# Patient Record
Sex: Female | Born: 1960 | Race: White | Hispanic: No | Marital: Married | Smoking: Never smoker
Health system: Southern US, Community
[De-identification: ages and names within clinical notes are randomized; demographics above are authoritative.]

## PROBLEM LIST (undated history)

## (undated) DIAGNOSIS — K219 Gastro-esophageal reflux disease without esophagitis: Secondary | ICD-10-CM

## (undated) DIAGNOSIS — T7840XA Allergy, unspecified, initial encounter: Secondary | ICD-10-CM

## (undated) DIAGNOSIS — C439 Malignant melanoma of skin, unspecified: Secondary | ICD-10-CM

## (undated) DIAGNOSIS — G43909 Migraine, unspecified, not intractable, without status migrainosus: Secondary | ICD-10-CM

## (undated) DIAGNOSIS — C4491 Basal cell carcinoma of skin, unspecified: Secondary | ICD-10-CM

## (undated) DIAGNOSIS — E079 Disorder of thyroid, unspecified: Secondary | ICD-10-CM

## (undated) DIAGNOSIS — E039 Hypothyroidism, unspecified: Secondary | ICD-10-CM

## (undated) DIAGNOSIS — B009 Herpesviral infection, unspecified: Secondary | ICD-10-CM

## (undated) DIAGNOSIS — D649 Anemia, unspecified: Secondary | ICD-10-CM

## (undated) DIAGNOSIS — M810 Age-related osteoporosis without current pathological fracture: Secondary | ICD-10-CM

## (undated) HISTORY — DX: Gastro-esophageal reflux disease without esophagitis: K21.9

## (undated) HISTORY — PX: OTHER SURGICAL HISTORY: SHX169

## (undated) HISTORY — DX: Age-related osteoporosis without current pathological fracture: M81.0

## (undated) HISTORY — DX: Disorder of thyroid, unspecified: E07.9

## (undated) HISTORY — DX: Malignant melanoma of skin, unspecified: C43.9

## (undated) HISTORY — PX: LASIK: SHX215

## (undated) HISTORY — DX: Basal cell carcinoma of skin, unspecified: C44.91

## (undated) HISTORY — PX: TONSILLECTOMY: SUR1361

## (undated) HISTORY — PX: BASAL CELL CARCINOMA EXCISION: SHX1214

## (undated) HISTORY — DX: Herpesviral infection, unspecified: B00.9

## (undated) HISTORY — PX: MOHS SURGERY: SUR867

## (undated) HISTORY — PX: LAPAROSCOPIC CHOLECYSTECTOMY: SUR755

## (undated) HISTORY — DX: Migraine, unspecified, not intractable, without status migrainosus: G43.909

## (undated) HISTORY — PX: GYNECOLOGIC CRYOSURGERY: SHX857

## (undated) HISTORY — DX: Allergy, unspecified, initial encounter: T78.40XA

## (undated) HISTORY — DX: Anemia, unspecified: D64.9

---

## 1998-01-16 ENCOUNTER — Other Ambulatory Visit: Admission: RE | Admit: 1998-01-16 | Discharge: 1998-01-16 | Payer: Self-pay | Admitting: Gynecology

## 1998-06-25 ENCOUNTER — Other Ambulatory Visit: Admission: RE | Admit: 1998-06-25 | Discharge: 1998-06-25 | Payer: Self-pay | Admitting: Gynecology

## 1999-08-18 ENCOUNTER — Other Ambulatory Visit: Admission: RE | Admit: 1999-08-18 | Discharge: 1999-08-18 | Payer: Self-pay | Admitting: Gynecology

## 1999-11-13 ENCOUNTER — Encounter: Payer: Self-pay | Admitting: Gastroenterology

## 1999-11-13 ENCOUNTER — Encounter (INDEPENDENT_AMBULATORY_CARE_PROVIDER_SITE_OTHER): Payer: Self-pay | Admitting: *Deleted

## 1999-11-13 ENCOUNTER — Ambulatory Visit (HOSPITAL_COMMUNITY): Admission: RE | Admit: 1999-11-13 | Discharge: 1999-11-13 | Payer: Self-pay | Admitting: Gastroenterology

## 1999-11-20 ENCOUNTER — Ambulatory Visit (HOSPITAL_COMMUNITY): Admission: RE | Admit: 1999-11-20 | Discharge: 1999-11-20 | Payer: Self-pay | Admitting: Gastroenterology

## 1999-11-20 ENCOUNTER — Encounter (INDEPENDENT_AMBULATORY_CARE_PROVIDER_SITE_OTHER): Payer: Self-pay | Admitting: *Deleted

## 2000-01-14 ENCOUNTER — Ambulatory Visit (HOSPITAL_COMMUNITY): Admission: RE | Admit: 2000-01-14 | Discharge: 2000-01-14 | Payer: Self-pay | Admitting: Gastroenterology

## 2000-01-14 ENCOUNTER — Encounter: Payer: Self-pay | Admitting: Gastroenterology

## 2000-01-25 ENCOUNTER — Encounter: Admission: RE | Admit: 2000-01-25 | Discharge: 2000-01-25 | Payer: Self-pay | Admitting: Gastroenterology

## 2000-01-25 ENCOUNTER — Encounter: Payer: Self-pay | Admitting: Gastroenterology

## 2000-11-15 ENCOUNTER — Other Ambulatory Visit: Admission: RE | Admit: 2000-11-15 | Discharge: 2000-11-15 | Payer: Self-pay | Admitting: Gynecology

## 2001-05-04 ENCOUNTER — Other Ambulatory Visit: Admission: RE | Admit: 2001-05-04 | Discharge: 2001-05-04 | Payer: Self-pay | Admitting: Obstetrics and Gynecology

## 2001-12-26 ENCOUNTER — Other Ambulatory Visit: Admission: RE | Admit: 2001-12-26 | Discharge: 2001-12-26 | Payer: Self-pay | Admitting: Gynecology

## 2003-03-19 ENCOUNTER — Other Ambulatory Visit: Admission: RE | Admit: 2003-03-19 | Discharge: 2003-03-19 | Payer: Self-pay | Admitting: Gynecology

## 2004-03-31 ENCOUNTER — Other Ambulatory Visit: Admission: RE | Admit: 2004-03-31 | Discharge: 2004-03-31 | Payer: Self-pay | Admitting: Gynecology

## 2005-05-20 ENCOUNTER — Other Ambulatory Visit: Admission: RE | Admit: 2005-05-20 | Discharge: 2005-05-20 | Payer: Self-pay | Admitting: Gynecology

## 2006-01-18 ENCOUNTER — Ambulatory Visit (HOSPITAL_COMMUNITY): Admission: RE | Admit: 2006-01-18 | Discharge: 2006-01-18 | Payer: Self-pay | Admitting: Internal Medicine

## 2006-02-25 ENCOUNTER — Encounter (INDEPENDENT_AMBULATORY_CARE_PROVIDER_SITE_OTHER): Payer: Self-pay | Admitting: Specialist

## 2006-02-25 ENCOUNTER — Ambulatory Visit (HOSPITAL_COMMUNITY): Admission: RE | Admit: 2006-02-25 | Discharge: 2006-02-25 | Payer: Self-pay | Admitting: Surgery

## 2006-02-25 ENCOUNTER — Encounter (INDEPENDENT_AMBULATORY_CARE_PROVIDER_SITE_OTHER): Payer: Self-pay | Admitting: *Deleted

## 2008-09-30 DIAGNOSIS — K222 Esophageal obstruction: Secondary | ICD-10-CM

## 2008-10-01 ENCOUNTER — Ambulatory Visit: Payer: Self-pay | Admitting: Gastroenterology

## 2008-10-01 DIAGNOSIS — K5900 Constipation, unspecified: Secondary | ICD-10-CM | POA: Insufficient documentation

## 2008-10-01 DIAGNOSIS — R519 Headache, unspecified: Secondary | ICD-10-CM | POA: Insufficient documentation

## 2008-10-01 DIAGNOSIS — Z8719 Personal history of other diseases of the digestive system: Secondary | ICD-10-CM

## 2008-10-01 DIAGNOSIS — C439 Malignant melanoma of skin, unspecified: Secondary | ICD-10-CM | POA: Insufficient documentation

## 2008-10-01 DIAGNOSIS — C4490 Unspecified malignant neoplasm of skin, unspecified: Secondary | ICD-10-CM

## 2008-10-01 DIAGNOSIS — R51 Headache: Secondary | ICD-10-CM | POA: Insufficient documentation

## 2008-10-01 DIAGNOSIS — K219 Gastro-esophageal reflux disease without esophagitis: Secondary | ICD-10-CM | POA: Insufficient documentation

## 2008-10-01 DIAGNOSIS — D649 Anemia, unspecified: Secondary | ICD-10-CM

## 2008-10-01 LAB — CONVERTED CEMR LAB: Tissue Transglutaminase Ab, IgA: 0.5 units (ref <7)

## 2008-10-02 ENCOUNTER — Telehealth: Payer: Self-pay | Admitting: Gastroenterology

## 2008-10-02 LAB — CONVERTED CEMR LAB
AST: 16 units/L (ref 0–37)
Albumin: 4 g/dL (ref 3.5–5.2)
Alkaline Phosphatase: 35 units/L — ABNORMAL LOW (ref 39–117)
BUN: 15 mg/dL (ref 6–23)
Basophils Relative: 0.8 % (ref 0.0–3.0)
CO2: 28 meq/L (ref 19–32)
Calcium: 9.1 mg/dL (ref 8.4–10.5)
Eosinophils Relative: 2.1 % (ref 0.0–5.0)
Folate: 14.6 ng/mL
GFR calc non Af Amer: 94.15 mL/min (ref >60)
Glucose, Bld: 91 mg/dL (ref 70–99)
HCT: 35.7 % — ABNORMAL LOW (ref 36.0–46.0)
Hemoglobin: 11.6 g/dL — ABNORMAL LOW (ref 12.0–15.0)
Lymphs Abs: 2.2 10*3/uL (ref 0.7–4.0)
MCV: 82.9 fL (ref 78.0–100.0)
Monocytes Absolute: 0.8 10*3/uL (ref 0.1–1.0)
Monocytes Relative: 11.3 % (ref 3.0–12.0)
Platelets: 375 10*3/uL (ref 150.0–400.0)
RBC: 4.3 M/uL (ref 3.87–5.11)
Saturation Ratios: 3.9 % — ABNORMAL LOW (ref 20.0–50.0)
Sodium: 142 meq/L (ref 135–145)
TSH: 4.85 microintl units/mL (ref 0.35–5.50)
Total Protein: 7.5 g/dL (ref 6.0–8.3)
WBC: 7.5 10*3/uL (ref 4.5–10.5)

## 2008-10-19 DIAGNOSIS — C439 Malignant melanoma of skin, unspecified: Secondary | ICD-10-CM

## 2008-10-19 HISTORY — DX: Malignant melanoma of skin, unspecified: C43.9

## 2008-11-06 ENCOUNTER — Ambulatory Visit: Payer: Self-pay | Admitting: Gastroenterology

## 2008-11-06 ENCOUNTER — Encounter: Payer: Self-pay | Admitting: Gastroenterology

## 2008-11-06 DIAGNOSIS — K2990 Gastroduodenitis, unspecified, without bleeding: Secondary | ICD-10-CM

## 2008-11-06 DIAGNOSIS — K2970 Gastritis, unspecified, without bleeding: Secondary | ICD-10-CM | POA: Insufficient documentation

## 2008-11-06 LAB — CONVERTED CEMR LAB: UREASE: NEGATIVE

## 2008-11-08 ENCOUNTER — Encounter: Payer: Self-pay | Admitting: Gastroenterology

## 2008-11-20 ENCOUNTER — Ambulatory Visit: Payer: Self-pay | Admitting: Gastroenterology

## 2008-11-21 ENCOUNTER — Telehealth: Payer: Self-pay | Admitting: Gastroenterology

## 2008-12-17 ENCOUNTER — Ambulatory Visit: Payer: Self-pay | Admitting: Gastroenterology

## 2008-12-18 LAB — CONVERTED CEMR LAB
Ferritin: 13.4 ng/mL (ref 10.0–291.0)
Vitamin B-12: 387 pg/mL (ref 211–911)

## 2009-02-04 ENCOUNTER — Ambulatory Visit: Payer: Self-pay | Admitting: Gastroenterology

## 2009-04-10 ENCOUNTER — Telehealth (INDEPENDENT_AMBULATORY_CARE_PROVIDER_SITE_OTHER): Payer: Self-pay | Admitting: *Deleted

## 2009-08-04 ENCOUNTER — Telehealth: Payer: Self-pay | Admitting: Gastroenterology

## 2009-11-24 ENCOUNTER — Encounter: Admission: RE | Admit: 2009-11-24 | Discharge: 2009-11-24 | Payer: Self-pay | Admitting: Obstetrics & Gynecology

## 2010-07-09 ENCOUNTER — Other Ambulatory Visit: Payer: Self-pay | Admitting: Dermatology

## 2010-07-12 ENCOUNTER — Encounter: Payer: Self-pay | Admitting: Obstetrics & Gynecology

## 2010-07-23 NOTE — Progress Notes (Signed)
Summary: Medication refill  Phone Note Call from Patient Call back at Work Phone [phone]   Caller: Patient Call For: Dr. Jarold Motto Reason for Call: Refill Medication Summary of Call: Pt needs a refill on her Nexium Initial call taken by: Karna Christmas,  August 04, 2009 12:11 PM  Follow-up for Phone Call        LM for pt that [facility]. Follow-up by: Ashok Cordia RN,  August 04, 2009 1:54 PM    Prescriptions: NEXIUM 40 MG CPDR (ESOMEPRAZOLE MAGNESIUM) 1 capsule by mouth two times a day  #60 x 6   Entered by:   Ashok Cordia RN   Authorized by:   Mardella Layman MD [facility]   Signed by:   Ashok Cordia RN on 08/04/2009   Method used:   [facility] to        [facility] [phone]* (retail)       [address]       [place], [place]  [address]       Ph: [phone] or [phone]       Fax: [phone]   RxID:   [id number]   Appended Document: Medication refill Pt called back,  Wants rx sent to [facility].     Clinical Lists Changes  Medications: Rx of NEXIUM 40 MG CPDR (ESOMEPRAZOLE MAGNESIUM) 1 capsule by mouth two times a day;  #180 x 4;  Signed;  Entered by: Ashok Cordia RN;  Authorized by: Mardella Layman MD [facility];  Method used: Electronically to [facility]*, , ,   , Ph: [phone], Fax: [phone]    Prescriptions: NEXIUM 40 MG CPDR (ESOMEPRAZOLE MAGNESIUM) 1 capsule by mouth two times a day  #180 x 4   Entered by:   Ashok Cordia RN   Authorized by:   Mardella Layman MD [facility]   Signed by:   Ashok Cordia RN on 08/04/2009   Method used:   Electronically to        [facility]* (mail-order)             ,          Ph: [phone]       Fax: [phone]   RxID:   [id number]

## 2010-09-28 ENCOUNTER — Other Ambulatory Visit: Payer: Self-pay | Admitting: Gastroenterology

## 2010-09-28 NOTE — Telephone Encounter (Signed)
Needs office visit.

## 2010-10-20 ENCOUNTER — Other Ambulatory Visit: Payer: Self-pay | Admitting: Obstetrics & Gynecology

## 2010-10-20 DIAGNOSIS — Z1231 Encounter for screening mammogram for malignant neoplasm of breast: Secondary | ICD-10-CM

## 2010-11-06 NOTE — Op Note (Signed)
NAMELENORE, MOYANO               ACCOUNT NO.:  [id number]   MEDICAL RECORD NO.:  [id number]          PATIENT TYPE:  AMB   LOCATION:  DAY                          FACILITY:  [facility]   PHYSICIAN:  Wilmon Arms. Corliss Skains, M.D. DATE OF BIRTH:  04-25-1961   DATE OF PROCEDURE:  DATE OF DISCHARGE:                                 OPERATIVE REPORT   PREOPERATIVE DIAGNOSIS:  Chronic calculous cholecystitis.   POSTOPERATIVE DIAGNOSIS:  Chronic calculous cholecystitis.   PROCEDURE PERFORMED:  Laparoscopic cholecystectomy with intraoperative  cholangiogram.   SURGEON:  Wilmon Arms. Tsuei, M.D.   ANESTHESIA:  General endotracheal.   INDICATIONS:  The patient is a healthy 50 year old female who presents with  several years of epigastric pain.  Recently this has become more severe.  It  is associated with nausea, bloating and radiation into her back.  This  usually is postprandial in nature.  An ultrasound showed multiple gallstones  with normal gallbladder wall thickness.  Liver function tests were normal.  She was referred for surgical evaluation and we recommended laparoscopic  cholecystectomy.   DESCRIPTION OF PROCEDURE:  The patient brought to the operating room, placed  in the supine position on operating table.  After an adequate level of  general anesthesia was obtained, the patient's abdomen was prepped with  Betadine and draped in sterile fashion.  A time-out was taken to assure  proper patient, proper procedure.  Her umbilicus was infiltrated with 0.25%  Marcaine.  A vertical incision was made just below umbilicus.  Dissection  was carried down to the fascia which was opened vertically.  The peritoneal  cavity was bluntly entered.  The pursestring sutures of 0 Vicryl was placed  around the fascial opening.  A Hasson cannula was inserted and secured with  stay suture.  Pneumoperitoneum was obtained by insufflating CO2 maintaining  maximal pressure of 15 mmHg.  The laparoscope was inserted  as the patient  was rotated in reverse Trendelenburg position rotated slightly to her left.  A very large liver was noted.  A 10 mm port was placed in subxiphoid  position.  Two 5 mL ports placed in right upper quadrant.  The liver was  retracted superiorly exposing the gallbladder.  There were some flimsy  omental adhesions to the surface of the gallbladder which were taken down  with cautery.  The gallbladder was retracted superiorly over the edge of the  liver.  We dissected around the hilum of the gallbladder exposing the cystic  duct.  We also identified the cystic artery.  These were both ligated with  clips.  The cystic duct was ligated distally.  A small opening was created  on the cystic duct and a Cook cholangiogram catheter was threaded into the  cystic duct.  A cholangiogram was obtained showing good flow proximally and  distally with good flow of contrast into the duodenum and no evidence of  filling defects.  The catheter was then removed.  The cystic duct was  ligated with clips and divided.  An additional branch of the cystic artery  was ligated with clips and divided.  Cautery was then used to remove the  gallbladder from the liver bed.  The gallbladder was placed in EndoCatch  sac.  We irrigated the right upper quadrant and hemostasis was felt to be  good.  The patient was rotated back to a flat position and the gallbladder  was removed  through the umbilical port.  The irrigant was suctioned out of right upper  quadrant.  No bleeding was noted.  Pneumoperitoneum was then evacuated as  ports were removed under direct vision.  4-0 Monocryl was used to close the  skin in subcuticular fashion.  All sponge, instrument, needle counts  correct.      Wilmon Arms. Tsuei, M.D.  Electronically Signed     MKT/MEDQ  D:  02/25/2006  T:  02/25/2006  Job:  [id number]

## 2010-11-06 NOTE — Procedures (Signed)
[facility]  Patient:    Jaclyn Alexander, Jaclyn Alexander                      MRN: [id number] Proc. Date: 11/13/99 Adm. Date:  [id number] Disc. Date: [id number] Attending:  Deneen Harts CC:         Dr. Mikle Bosworth, Rosalita Levan                           Procedure Report  PROCEDURE:  Panendoscopy, Savary dilation.  INDICATIONS:  A 50 year old white female with recurrent episodes of substernal pain associated with solid food dysphagia, transient impaction.  Remote history of endoscopy January 1998 for similar symptoms.  Patient has been started on NuLev with excellent results.  PROCEDURE:  Using Olympus video endoscope, proximal esophagus was intubated under direct vision following IV sedation totally Versed 5 mg, fentanyl 50 mcg.  Oropharynx was normal.  No lesion of the epiglottis, vocal cords or piriform sinus.  Proximal, mid and distal segments of the esophagus were all normal. Hiatal hernia was not appreciated.  Mucosal Z-line was distinct at 40 cm.  No evidence of stricture.  Gastric fundus, body and antrum were normal.  Pylorus symmetric.  Duodenal bulb and second portion normal.  Retroflexed view of the angularis, lesser curvature, gastric cardia, fundus negative.  Scope withdrawn and Savary guidewire laid along the greater curve of the stomach.  Position maintained fluoroscopically upon withdrawal of the endoscope.  Over the Orad segment of the guidewire, a 17 mm diameter Savary dilator was advanced.  Under fluoroscopic control, this was seen to bridge the diaphragm. The dilator and guidewire were simultaneously withdrawn.  Patient tolerated the procedure without difficulty.  No heme staining, no chest pain.  Patient returned to recovery in stable condition.  Maintained on Datascope monitor throughout.  Maintained on low-flow oxygen throughout.  ASSESSMENT: 1. Normal endoscopy. 2. Savary dilation to 17 mm diameter.  RECOMMENDATION: 1. Antireflux  measures. 2. Continue NuLev. 3. Continue Prilosec 20 mg daily for one month. 4. ROV p.r.n. DD:  11/13/99 TD:  11/17/99 Job: [id number]

## 2010-11-06 NOTE — Procedures (Signed)
[facility]  Patient:    Jaclyn Alexander, Jaclyn Alexander                      MRN: [id number] Proc. Date: 11/20/99 Adm. Date:  [id number] Disc. Date: [id number] Attending:  Deneen Harts CC:         Dr. Talmadge Coventry Linden                           Procedure Report  PROCEDURE:  Colonoscopy.  INDICATION:  A 50 year old white female with intermittent hematochezia. Undergoing colonoscopy for neoplasia surveillance.  DESCRIPTION OF PROCEDURE:  After reviewing the nature of the procedure with the patient, including potential risks and complications, and after discussing alternative methods of diagnosis and treatment, informed consent was signed.  The patient was premedicated, receiving IV sedation totalling Versed 10 mg, fentanyl 100 mcg, administered IV in divided doses prior to and during the course of the procedure.  Using an Olympus pediatric PCF-140L video colonoscope, rectum was intubated after normal digital examination.  The scope was advanced around the entire length of the colon, which was extremely difficult due to a very redundant colon.  The patient has a very steep transverse colon that dives deep into the pelvis.  The patient had to be rotated into a right lateral decubitus position and then with this maneuver, it still required approximately 45 minutes of intubation time.  Preparation was excellent throughout.  The cecum was identified by the appendiceal orifice, ileocecal valve.  Photo documentation obtained.  The scope was slowly withdrawn with careful inspection of the entire colon in a retrograde manner, including retroflexed view in the rectal vault. Examination was entirely normal.  There was no evidence of neoplasia, mucosal inflammation, diverticular disease, or vascular lesion.  Retroflexed view in the rectal vault was normal.  The colon was decompressed, scope withdrawn.  The patient tolerated the procedure without difficulty, being  maintained on Dayscope monitor, low-flow oxygen throughout.  ASSESSMENT: 1. Normal colonoscopy. 2. Difficult intubation.  RECOMMENDATION: 1. Annual Hemoccult. 2. Colonoscopy ten years. 3. Anorectal care p.r.n. 4. Continue Miralax one capsule nightly. DD:  11/20/99 TD:  11/24/99 Job: [id number]

## 2010-12-20 ENCOUNTER — Other Ambulatory Visit: Payer: Self-pay | Admitting: Gastroenterology

## 2010-12-24 ENCOUNTER — Ambulatory Visit: Payer: Self-pay

## 2011-01-01 ENCOUNTER — Ambulatory Visit
Admission: RE | Admit: 2011-01-01 | Discharge: 2011-01-01 | Disposition: A | Discharge status: Home / Self Care | Payer: PRIVATE HEALTH INSURANCE | Source: Ambulatory Visit | Attending: Obstetrics & Gynecology | Admitting: Obstetrics & Gynecology

## 2011-01-01 DIAGNOSIS — Z1231 Encounter for screening mammogram for malignant neoplasm of breast: Secondary | ICD-10-CM

## 2011-03-04 ENCOUNTER — Other Ambulatory Visit: Payer: Self-pay | Admitting: Gastroenterology

## 2011-06-22 DIAGNOSIS — C4491 Basal cell carcinoma of skin, unspecified: Secondary | ICD-10-CM

## 2011-06-22 HISTORY — DX: Basal cell carcinoma of skin, unspecified: C44.91

## 2011-12-10 ENCOUNTER — Other Ambulatory Visit: Payer: Self-pay | Admitting: Obstetrics & Gynecology

## 2011-12-10 DIAGNOSIS — Z1231 Encounter for screening mammogram for malignant neoplasm of breast: Secondary | ICD-10-CM

## 2012-01-26 ENCOUNTER — Ambulatory Visit: Payer: PRIVATE HEALTH INSURANCE

## 2012-02-03 ENCOUNTER — Ambulatory Visit
Admission: RE | Admit: 2012-02-03 | Discharge: 2012-02-03 | Disposition: A | Discharge status: Home / Self Care | Payer: PRIVATE HEALTH INSURANCE | Source: Ambulatory Visit | Attending: Obstetrics & Gynecology | Admitting: Obstetrics & Gynecology

## 2012-02-03 DIAGNOSIS — Z1231 Encounter for screening mammogram for malignant neoplasm of breast: Secondary | ICD-10-CM

## 2012-05-29 DIAGNOSIS — G43009 Migraine without aura, not intractable, without status migrainosus: Secondary | ICD-10-CM | POA: Insufficient documentation

## 2012-10-02 ENCOUNTER — Other Ambulatory Visit: Payer: Self-pay | Admitting: Obstetrics & Gynecology

## 2012-10-02 NOTE — Telephone Encounter (Signed)
Aex sched for 02/08/13 s/w pt she is needing rf's will rf untii

## 2012-10-02 NOTE — Telephone Encounter (Signed)
Aex sched for 02/08/13 s/w pt she is needing rf's will rf until aex//js

## 2012-10-02 NOTE — Telephone Encounter (Signed)
Can I see her chart

## 2012-10-03 NOTE — Telephone Encounter (Signed)
Ok to refill until aex.  Note in chart says has been refilled??

## 2012-10-05 NOTE — Telephone Encounter (Signed)
Yes [facility] has been [facility] 10/02/12

## 2013-01-16 ENCOUNTER — Other Ambulatory Visit: Payer: Self-pay

## 2013-01-16 DIAGNOSIS — Z1231 Encounter for screening mammogram for malignant neoplasm of breast: Secondary | ICD-10-CM

## 2013-02-08 ENCOUNTER — Ambulatory Visit: Payer: Self-pay | Admitting: Obstetrics & Gynecology

## 2013-03-29 ENCOUNTER — Encounter: Payer: Self-pay | Admitting: Obstetrics & Gynecology

## 2013-03-30 ENCOUNTER — Encounter: Payer: Self-pay | Admitting: Obstetrics & Gynecology

## 2013-03-30 ENCOUNTER — Ambulatory Visit
Admission: RE | Admit: 2013-03-30 | Discharge: 2013-03-30 | Disposition: A | Discharge status: Home / Self Care | Payer: 59 | Source: Ambulatory Visit

## 2013-03-30 ENCOUNTER — Ambulatory Visit (INDEPENDENT_AMBULATORY_CARE_PROVIDER_SITE_OTHER): Payer: 59 | Admitting: Obstetrics & Gynecology

## 2013-03-30 VITALS — BP 128/84 | HR 60 | Resp 16 | Ht 63.75 in | Wt 119.0 lb

## 2013-03-30 DIAGNOSIS — Z1231 Encounter for screening mammogram for malignant neoplasm of breast: Secondary | ICD-10-CM

## 2013-03-30 DIAGNOSIS — Z0000 Encounter for general adult medical examination without abnormal findings: Secondary | ICD-10-CM

## 2013-03-30 DIAGNOSIS — Z01419 Encounter for gynecological examination (general) (routine) without abnormal findings: Secondary | ICD-10-CM

## 2013-03-30 LAB — POCT URINALYSIS DIPSTICK
Bilirubin, UA: NEGATIVE
Blood, UA: NEGATIVE
Glucose, UA: NEGATIVE
Ketones, UA: NEGATIVE
Leukocytes, UA: NEGATIVE
Nitrite, UA: NEGATIVE
pH, UA: 6

## 2013-03-30 LAB — TSH: TSH: 2.669 u[IU]/mL (ref 0.350–4.500)

## 2013-03-30 MED ORDER — ESTRADIOL 0.1 MG/GM VA CREA: TOPICAL_CREAM | VAGINAL | Status: DC

## 2013-03-30 MED ORDER — LEVOTHYROXINE SODIUM 50 MCG PO TABS: ORAL_TABLET | ORAL | Status: DC

## 2013-03-30 NOTE — Progress Notes (Deleted)
52 y.o. G1P1 MarriedCaucasianF here for annual exam.    Patient's last menstrual period was 10/20/2010.          Sexually active: yes  The current method of family planning is none.    Exercising: yes  walking 4 x weekly Smoker:  no  Health Maintenance: Pap:  02/03/12 WNL/negative HR HPV History of abnormal Pap:  yes MMG:  02/03/12 normal-scheduled today for 3D Colonoscopy:  6/10 repeat in 10 years BMD:   2012 [facility] TDaP:  7/12 Screening Labs: ***, Hb today: 12.2, Urine today: PH-6.0   reports that she has never smoked. She has never used smokeless tobacco. She reports that she does not drink alcohol or use illicit drugs.  Past Medical History  Diagnosis Date  . Melanoma 5/10    basal cell  . Acid reflux   . Migraine   . Anemia   . HSV-2 infection   . Basal cell carcinoma 1/13    Past Surgical History  Procedure Laterality Date  . Laparoscopic cholecystectomy    . Tonsillectomy    . Dental implants      x2  . Lasik    . Gynecologic cryosurgery  1980s  . Mohs surgery      Current Outpatient Prescriptions  Medication Sig Dispense Refill  . fluticasone (FLONASE) 50 MCG/ACT nasal spray Place 2 sprays into the nose as needed for rhinitis.      Marland Kitchen levothyroxine (SYNTHROID, LEVOTHROID) 50 MCG tablet TAKE 1 TABLET DAILY  90 tablet  1  . omeprazole (PRILOSEC) 20 MG capsule Take 20 mg by mouth 2 (two) times daily.      Marland Kitchen topiramate (TOPAMAX) 100 MG tablet Take 100 mg by mouth daily.      . valACYclovir (VALTREX) 500 MG tablet Take 500 mg by mouth daily.       No current facility-administered medications for this visit.    Family History  Problem Relation Age of Onset  . Lung cancer Mother   . Cancer Other     maternal side-lung cancer and melanoma  . Hypertension Paternal Grandfather   . Heart attack Father   . Stroke Other     maternal side  . Bipolar disorder Brother   . Thyroid disease Mother     ROS:  Pertinent items are noted in HPI.  Otherwise, a  comprehensive ROS was negative.  Exam:   BP 128/84  Pulse 60  Resp 16  Ht 5' 3.75" (1.619 m)  Wt 119 lb (53.978 kg)  BMI 20.59 kg/m2  LMP 10/20/2010  Weight change: @WEIGHTCHANGE @ Height:   Height: 5' 3.75" (161.9 cm)  Ht Readings from Last 3 Encounters:  03/30/13 5' 3.75" (1.619 m)  02/04/09 5\' 4"  (1.626 m)  12/17/08 5\' 4"  (1.626 m)    General appearance: alert, cooperative and appears stated age Head: Normocephalic, without obvious abnormality, atraumatic Neck: no adenopathy, supple, symmetrical, trachea midline and thyroid {EXAM; THYROID:18604} Lungs: clear to auscultation bilaterally Breasts: {Exam; breast:13139::"normal appearance, no masses or tenderness"} Heart: regular rate and rhythm Abdomen: soft, non-tender; bowel sounds normal; no masses,  no organomegaly Extremities: extremities normal, atraumatic, no cyanosis or edema Skin: Skin color, texture, turgor normal. No rashes or lesions Lymph nodes: Cervical, supraclavicular, and axillary nodes normal. No abnormal inguinal nodes palpated Neurologic: Grossly normal   Pelvic: External genitalia:  no lesions              Urethra:  normal appearing urethra with no masses, tenderness or lesions  Bartholins and Skenes: normal                 Vagina: normal appearing vagina with normal color and discharge, no lesions              Cervix: {exam; cervix:14595}              Pap taken: {yes no:314532} Bimanual Exam:  Uterus:  {exam; uterus:12215}              Adnexa: {exam; adnexa:12223}               Rectovaginal: Confirms               Anus:  normal sphincter tone, no lesions  A:  Well Woman with normal exam  P:   {plan; gyn:5269::"mammogram","pap smear","return annually or prn"}  An After Visit Summary was printed and given to the patient.

## 2013-03-30 NOTE — Patient Instructions (Signed)
HOME TREATMENT OF BPPV: BRANDT-DAROFF EXERCISES  Click here for a low bandwidth animation  The Brandt-Daroff Exercises are a home method of treating BPPV, usually used when the side of BPPV is unclear. Their use has been declining in recent years, as the home Epley maneuver (see below) is considerably more effective. They succeed in 95% of cases but are more arduous than the office treatments. These exercises also may take longer than the other maneuvers -- the response rate at one week is only about 25% [employer] et al, 1999). These exercises are performed in three sets per day for two weeks. In each set, one performs the maneuver as shown five times.  1 repetition = maneuver done to each side in turn (takes 2 minutes)  Suggested Schedule for Brandt-Daroff exercises  Time Exercise Duration  Morning 5 repetitions 10 minutes  Noon 5 repetitions 10 minutes  Evening 5 repetitions 10 minutes  Start sitting upright (position 1). Then move into the side-lying position (position 2), with the head angled upward about halfway. An easy way to remember this is to imagine someone standing about 6 feet in front of you, and just keep looking at their head at all times. Stay in the side-lying position for 30 seconds, or until the dizziness subsides if this is longer, then go back to the sitting position (position 3). Stay there for 30 seconds, and then go to the opposite side (position 4) and follow the same routine. These exercises should be performed for two weeks, three times per day, or for three weeks, twice per day. This adds up to 42 sets in total. In most persons, complete relief from symptoms is obtained after 30 sets, or about 10 days. In approximately 30 percent of patients, BPPV will recur within one year. Unfortunately, daily exercises are not effective in preventing recurrence (Helminski and Hain, 2008). The Brandt-Daroff exercises as well as the Semont and Epley maneuvers are compared in an article by Francee Piccolo  (1994), listed in the reference section. When performing the Brandt-Daroff maneuver, caution is advised should neurological symptoms (i.e. weakness, numbness, visual changes other than vertigo) occur. Occasionally such symptoms are caused by compression of the vertebral arteries Rhett Bannister et al, 2003). In this situation we advise not proceeding with the exercises and consulting ones physician. Multicanal BPPV (usually mild) often is a consequence of using the Brandt-Daroff exercises.

## 2013-03-30 NOTE — Progress Notes (Signed)
52 y.o. G1P1 MarriedCaucasianF here for annual exam.  New grand daughter 35 old.  They live in [place].  Pt went and spent a week after her birth.  Will go back up around Thanksgiving.  Patient fell down the stairs with grandchild and really bruised leg and hip.  Did go to ortho and had xrays.  Has had vertigo three times since she fell.    No vaginal bleeding.  Seeing derm every year.  Patient's last menstrual period was 10/20/2010.          Sexually active: yes  The current method of family planning is post menopausal status.    Exercising: yes, walking Smoker:  no  Health Maintenance: Pap:  8/13 neg with neg HR HPV History of abnormal Pap:  no MMG:  Scheduled today.  [facility] Colonoscopy:  6/10, Dr Jarold Motto, f/u 10 yr BMD:   [facility], 2012 or 2013 TDaP:  01/01/11, here Screening Labs: 2013, Hb today: penidng, Urine today: pending   reports that she has never smoked. She has never used smokeless tobacco. She reports that she does not drink alcohol or use illicit drugs.  Past Medical History  Diagnosis Date  . Melanoma 5/10    basal cell  . Acid reflux   . Migraine   . Anemia   . HSV-2 infection   . Basal cell carcinoma 1/13    Past Surgical History  Procedure Laterality Date  . Laparoscopic cholecystectomy    . Tonsillectomy    . Dental implants      x2  . Lasik    . Gynecologic cryosurgery  1980s  . Mohs surgery      Current Outpatient Prescriptions  Medication Sig Dispense Refill  . fluticasone (FLONASE) 50 MCG/ACT nasal spray Place 2 sprays into the nose as needed for rhinitis.      Marland Kitchen levothyroxine (SYNTHROID, LEVOTHROID) 50 MCG tablet TAKE 1 TABLET DAILY  90 tablet  1  . omeprazole (PRILOSEC) 20 MG capsule Take 20 mg by mouth 2 (two) times daily.      Marland Kitchen topiramate (TOPAMAX) 100 MG tablet Take 100 mg by mouth daily.      . valACYclovir (VALTREX) 500 MG tablet Take 500 mg by mouth daily.       No current facility-administered medications  for this visit.    Family History  Problem Relation Age of Onset  . Lung cancer Mother   . Cancer Other     maternal side-lung cancer and melanoma  . Hypertension Paternal Grandfather   . Heart attack Father   . Stroke Other     maternal side  . Bipolar disorder Brother   . Thyroid disease Mother     ROS:  Pertinent items are noted in HPI.  Otherwise, a comprehensive ROS was negative.  Exam:   BP 128/84  Pulse 60  Resp 16  Ht 5' 3.75" (1.619 m)  Wt 119 lb (53.978 kg)  BMI 20.59 kg/m2  LMP 10/20/2010  Weight change: -9lbs  Height: 5' 3.75" (161.9 cm)  Ht Readings from Last 3 Encounters:  03/30/13 5' 3.75" (1.619 m)  02/04/09 5\' 4"  (1.626 m)  12/17/08 5\' 4"  (1.626 m)    General appearance: alert, cooperative and appears stated age Head: Normocephalic, without obvious abnormality, atraumatic Neck: no adenopathy, supple, symmetrical, trachea midline and thyroid normal to inspection and palpation Lungs: clear to auscultation bilaterally Breasts: normal appearance, no masses or tenderness Heart: regular rate and rhythm Abdomen: soft, non-tender; bowel  sounds normal; no masses,  no organomegaly Extremities: extremities normal, atraumatic, no cyanosis or edema Skin: Skin color, texture, turgor normal. No rashes or lesions Lymph nodes: Cervical, supraclavicular, and axillary nodes normal. No abnormal inguinal nodes palpated Neurologic: Grossly normal   Pelvic: External genitalia:  no lesions              Urethra:  normal appearing urethra with no masses, tenderness or lesions              Bartholins and Skenes: normal                 Vagina: normal appearing vagina with normal color and discharge, no lesions              Cervix: no lesions              Pap taken: no Bimanual Exam:  Uterus:  normal size, contour, position, consistency, mobility, non-tender              Adnexa: normal adnexa and no mass, fullness, tenderness               Rectovaginal: Confirms                Anus:  normal sphincter tone, no lesions  A:  Well Woman with normal exam PMP, no HRT Recent vertigo after fall Hypothyroidism H/O fibroids H/O BCC.  Sees derm yearly Vaginal dryness  P:   Mammogram today pap smear with neg HR HPV 2013 Levothyroxine daily.  #90/4RF. TSH today Rx to pharmacy for estrace cream Exercises for vertigo given.  Pt TCB for referral if this does not resolve problem.  Has Rx for antivert., return annually or prn  An After Visit Summary was printed and given to the patient.

## 2013-04-17 ENCOUNTER — Telehealth: Payer: Self-pay

## 2013-04-17 NOTE — Telephone Encounter (Signed)
Lmtcb//kn 

## 2013-04-23 ENCOUNTER — Ambulatory Visit (INDEPENDENT_AMBULATORY_CARE_PROVIDER_SITE_OTHER): Payer: 59 | Admitting: Obstetrics & Gynecology

## 2013-04-23 ENCOUNTER — Encounter: Payer: Self-pay | Admitting: Obstetrics & Gynecology

## 2013-04-23 VITALS — BP 118/78 | HR 68 | Resp 16 | Ht 63.75 in | Wt 119.0 lb

## 2013-04-23 DIAGNOSIS — K219 Gastro-esophageal reflux disease without esophagitis: Secondary | ICD-10-CM

## 2013-04-23 DIAGNOSIS — M810 Age-related osteoporosis without current pathological fracture: Secondary | ICD-10-CM

## 2013-04-23 NOTE — Patient Instructions (Signed)
We will let you know about the Prolia pre certification.

## 2013-04-24 ENCOUNTER — Encounter: Payer: Self-pay | Admitting: Obstetrics & Gynecology

## 2013-04-24 NOTE — Telephone Encounter (Signed)
Spoke with patient re: BMD, appointment made to come in to discuss.

## 2013-04-25 ENCOUNTER — Encounter: Payer: Self-pay | Admitting: Obstetrics & Gynecology

## 2013-04-25 DIAGNOSIS — M810 Age-related osteoporosis without current pathological fracture: Secondary | ICD-10-CM | POA: Insufficient documentation

## 2013-04-25 NOTE — Addendum Note (Signed)
Addended by: Jerene Bears on: 04/25/2013 10:37 AM   Modules accepted: Orders

## 2013-04-25 NOTE — Progress Notes (Addendum)
Subjective:    The patient is a 32 yrs Married Caucasian G1P1  female who I am seeing in consultation for evaluation of osteoporosis. She was diagnosed with by a bone density scan in 03/29/12.  No hx of fracture.  No hx of fracture in family.  Does have long term exposure to reflux medicaitons  Osteoporosis Risk Factors  Nonmodifiable Personal Hx of fracture as an adult: no Hx of fracture in first-degree relative: no Caucasian race: yes Advanced age: no Female sex: yes Dementia: no Poor health/frailty: no  Potentially modifiable: Tobacco use: no Low body weight (<127 lbs): yes Estrogen deficiency  early menopause (age <45) or bilateral ovariectomy: no  prolonged premenopausal amenorrhea (>1 yr): no Low calcium intake (lifelong): no Alcohol use more than 2 drinks per day: no Recurrent falls: no Inadequate physical activity: no  Current calcium and Vit D intake:  none  Review of Systems A comprehensive review of systems was negative.     Objective:   PHYSICAL EXAM BP 118/78  Pulse 68  Resp 16  Ht 5' 3.75" (1.619 m)  Wt 119 lb (53.978 kg)  BMI 20.59 kg/m2  LMP 10/20/2010  General appearance: alert and cooperative  Imaging Bone Density: Spine T Score: -2.3, Hip T Score: -3.1   Done on 03/29/13 FRAX score:  10 year probability of hip fracture: 3.5 percent.                        10-year probability of major osteoporotic fractures combined is 11 percent.  (this was calculated while pt was in office)                                                Assessment:  Osteoporosis Hypo estrogen state  GERD, long term PPI use, would like new GI.  Feels not being heard with current GI. Plan:  1.  Patient counseled in adequate calcium and vitamin D exposure.  Calcium - 500 - 1000 mg elemental calcium/day in divided doses  Vitamin D - 800 IU/day   2.  Exercise recommended at least 30 minutes 3 times per week.  3.  Recommendation to avoid heavy ETOH use.  4.  Counseled to  avoid tobacco and second had smoke. 5.  Fall prevention discussed. 6.  Pharmacologic therapy therapy below discussed including risks and benefits.   Bisphosphonates po (Fosamax, Actonel, Boniva)  Bisphosphonate IV (Reclast)  Evista  Prolia subcutaneous   Forteo subcutaneous  Calcitonin nasal spray Patient considering Evista, Reclast and Prolia.  She is leaning towards Prolia.  Would like to know if covered.  7.  Repeat bone density in 2 years.  8.  Referral to Dr. Loreta Ave for possible upper GI.  ~20 minutes spent with patient >50% of time was in face to face discussion of above.

## 2013-04-26 ENCOUNTER — Other Ambulatory Visit: Payer: Self-pay

## 2013-05-07 ENCOUNTER — Telehealth: Payer: Self-pay | Admitting: Emergency Medicine

## 2013-05-07 NOTE — Telephone Encounter (Signed)
Received summary of benefits from Prolia. Advised Out of pocket cost will be 100.00. Will need prior authorization for Prolia injection. [facility].

## 2013-05-07 NOTE — Telephone Encounter (Signed)
Message copied by Joeseph Amor on Mon May 07, 2013 11:08 AM ------      Message from: Jerene Bears      Created: Wed Apr 25, 2013 10:26 AM      Regarding: prolia prior-auth       Ok to leave to Amy to do.  Pt with hx of osteoporosis.  Would like to start with Prolia.  Needs to know coverage.  Thanks.            MSM ------

## 2013-05-11 ENCOUNTER — Other Ambulatory Visit: Payer: Self-pay | Admitting: Obstetrics & Gynecology

## 2013-05-11 DIAGNOSIS — M810 Age-related osteoporosis without current pathological fracture: Secondary | ICD-10-CM

## 2013-05-21 NOTE — Telephone Encounter (Signed)
Prior Authorization received. Approved from 04/14/13-05/13/16.

## 2013-06-21 HISTORY — PX: COLONOSCOPY: SHX174

## 2013-06-21 HISTORY — PX: UPPER GASTROINTESTINAL ENDOSCOPY: SHX188

## 2013-07-02 MED ORDER — DENOSUMAB 60 MG/ML ~~LOC~~ SOLN: 60.0000 mg | Freq: Once | SUBCUTANEOUS | Status: DC

## 2013-07-02 NOTE — Telephone Encounter (Signed)
Labs obtained from Dr. Lorie Apley office and okay to proceed with injection per Dr. Sabra Heck. [facility].   Spoke with specialty pharmacy [phone].   Spoke with patient, advised that [facility] should contact her for scheduling delivery and when knows what day for delivery, then to call for appointment. Patient agreeable.

## 2013-07-03 ENCOUNTER — Telehealth: Payer: Self-pay | Admitting: Obstetrics & Gynecology

## 2013-07-03 MED ORDER — DENOSUMAB 60 MG/ML ~~LOC~~ SOLN: 60.0000 mg | Freq: Once | SUBCUTANEOUS | Status: DC

## 2013-07-03 NOTE — Telephone Encounter (Signed)
Spoke to [facility] pharmacist and order placed. They will contact patient for delivery.

## 2013-07-03 NOTE — Telephone Encounter (Signed)
enosumab (PROLIA) 60 MG/ML SOLN injection  Inject 60 mg into the skin once. Administer in upper arm, thigh, or abdomen, Starting 07/02/2013, Normal, Last Dose: Not Recorded  Refills: 0 ordered [id number] [place], [address] said they need a new prescription for it says that it cant be filled from the store and the store cant send it to them by law we have to send a new prescription to them.

## 2013-07-04 ENCOUNTER — Telehealth: Payer: Self-pay | Admitting: *Deleted

## 2013-07-04 NOTE — Telephone Encounter (Signed)
Call from [place], [facility], to confirm shipment address and date. Will ship on 07-10-13 to arrive 07-11-13.  Routing to provider for final review. Patient agreeable to disposition. Will close encounter

## 2013-07-12 ENCOUNTER — Telehealth: Payer: Self-pay | Admitting: Orthopedic Surgery

## 2013-07-12 NOTE — Telephone Encounter (Signed)
Spoke with pt to advise that her prolia injection is here. Scheduled nurse visit for tomorrow at 4:15 per pt request to best fir with her work schedule. Pt will double check with her supervisor and will call back if she needs to change the time.

## 2013-07-13 ENCOUNTER — Ambulatory Visit (INDEPENDENT_AMBULATORY_CARE_PROVIDER_SITE_OTHER): Payer: 59 | Admitting: *Deleted

## 2013-07-13 VITALS — BP 130/78 | HR 80 | Resp 18 | Wt 118.0 lb

## 2013-07-13 DIAGNOSIS — M810 Age-related osteoporosis without current pathological fracture: Secondary | ICD-10-CM

## 2013-07-13 MED ORDER — DENOSUMAB 60 MG/ML ~~LOC~~ SOLN
60.0000 mg | Freq: Once | SUBCUTANEOUS | Status: AC
  Administered 2013-07-13: 60 mg via SUBCUTANEOUS

## 2013-07-13 NOTE — Progress Notes (Signed)
Patient in for first injection of Prolia. Patient tolerated well. - She made appt for second for 01/10/2014.

## 2014-01-03 ENCOUNTER — Other Ambulatory Visit (INDEPENDENT_AMBULATORY_CARE_PROVIDER_SITE_OTHER): Payer: 59

## 2014-01-03 DIAGNOSIS — M810 Age-related osteoporosis without current pathological fracture: Secondary | ICD-10-CM

## 2014-01-03 LAB — COMPREHENSIVE METABOLIC PANEL
ALK PHOS: 34 U/L — AB (ref 39–117)
ALT: 12 U/L (ref 0–35)
AST: 14 U/L (ref 0–37)
Albumin: 3.9 g/dL (ref 3.5–5.2)
BILIRUBIN TOTAL: 1.1 mg/dL (ref 0.2–1.2)
BUN: 15 mg/dL (ref 6–23)
CO2: 24 mEq/L (ref 19–32)
CREATININE: 0.78 mg/dL (ref 0.50–1.10)
Calcium: 8.8 mg/dL (ref 8.4–10.5)
Chloride: 110 mEq/L (ref 96–112)
Glucose, Bld: 83 mg/dL (ref 70–99)
Potassium: 4.3 mEq/L (ref 3.5–5.3)
Sodium: 141 mEq/L (ref 135–145)
TOTAL PROTEIN: 6.5 g/dL (ref 6.0–8.3)

## 2014-01-03 LAB — PHOSPHORUS: Phosphorus: 3.3 mg/dL (ref 2.3–4.6)

## 2014-01-03 LAB — MAGNESIUM: MAGNESIUM: 2.3 mg/dL (ref 1.5–2.5)

## 2014-01-08 ENCOUNTER — Telehealth: Payer: Self-pay | Admitting: Emergency Medicine

## 2014-01-08 NOTE — Telephone Encounter (Signed)
[facility] called back to advise that they will ship tomorrow 7/22 for delivery on 01/10/14 but they could not confirm time of delivery.  Spoke with patient and she would like to change appointment to 01/11/14 to ensure that we have injection here in office at time of appointment. Appointment changed.  Routing to provider for final review. Patient agreeable to disposition. Will close encounter

## 2014-01-08 NOTE — Telephone Encounter (Signed)
Rx for prolia 60 mg/ml faxed to [facility] on 01/04/14.  Lab Results  Component Value Date   CALCIUM 8.8 01/03/2014   PHOS 3.3 01/03/2014   Has nurse appointment for injection on 01/10/14 at 1030. Patient notified she needs to authorize delivery for prolia so that it will be here for her appointment. She is given phone number for [facility] [phone] to authorize delivery.

## 2014-01-10 ENCOUNTER — Ambulatory Visit: Payer: 59

## 2014-01-11 ENCOUNTER — Ambulatory Visit (INDEPENDENT_AMBULATORY_CARE_PROVIDER_SITE_OTHER): Payer: 59 | Admitting: Gynecology

## 2014-01-11 ENCOUNTER — Telehealth: Payer: Self-pay | Admitting: Obstetrics & Gynecology

## 2014-01-11 VITALS — BP 118/78 | HR 60 | Ht 63.75 in | Wt 118.0 lb

## 2014-01-11 DIAGNOSIS — Z01812 Encounter for preprocedural laboratory examination: Secondary | ICD-10-CM

## 2014-01-11 DIAGNOSIS — M810 Age-related osteoporosis without current pathological fracture: Secondary | ICD-10-CM

## 2014-01-11 MED ORDER — DENOSUMAB 60 MG/ML ~~LOC~~ SOLN
60.0000 mg | Freq: Once | SUBCUTANEOUS | Status: AC
  Administered 2014-01-11: 60 mg via SUBCUTANEOUS

## 2014-01-11 NOTE — Progress Notes (Signed)
Pt is here for her 2nd prolia injection. Nent injection will be in 6 mths. Pt tolerated the injection well. Injection site: left arm CA: 8.8 on 01/03/14

## 2014-01-11 NOTE — Telephone Encounter (Signed)
Spoke with patient. Advised lab draw is to be done within 30 days of prolia injection.  Scheduled 07/01/14 lab draw for calcium and future order entered. Patient is already scheduled for prolia.

## 2014-01-11 NOTE — Telephone Encounter (Signed)
Patient came in for Prolia injection  today and is wondering if she will need labs done before her next scheduled Prolia 07/15/14.

## 2014-02-28 ENCOUNTER — Encounter: Payer: Self-pay | Admitting: Obstetrics & Gynecology

## 2014-02-28 MED ORDER — VALACYCLOVIR HCL 500 MG PO TABS: 500.0000 mg | ORAL_TABLET | Freq: Every day | ORAL | Status: DC

## 2014-02-28 NOTE — Telephone Encounter (Signed)
[facility] for 500mg  daily.  #90/3RF after message sent via MyChart from patient.

## 2014-04-15 ENCOUNTER — Other Ambulatory Visit: Payer: Self-pay

## 2014-04-15 DIAGNOSIS — Z1231 Encounter for screening mammogram for malignant neoplasm of breast: Secondary | ICD-10-CM

## 2014-04-22 ENCOUNTER — Encounter: Payer: Self-pay | Admitting: Obstetrics & Gynecology

## 2014-05-09 ENCOUNTER — Encounter: Payer: Self-pay | Admitting: Obstetrics & Gynecology

## 2014-05-09 ENCOUNTER — Ambulatory Visit (INDEPENDENT_AMBULATORY_CARE_PROVIDER_SITE_OTHER): Payer: 59 | Admitting: Obstetrics & Gynecology

## 2014-05-09 ENCOUNTER — Ambulatory Visit
Admission: RE | Admit: 2014-05-09 | Discharge: 2014-05-09 | Disposition: A | Discharge status: Home / Self Care | Payer: 59 | Source: Ambulatory Visit

## 2014-05-09 VITALS — BP 128/70 | HR 72 | Ht 63.75 in | Wt 119.0 lb

## 2014-05-09 DIAGNOSIS — Z124 Encounter for screening for malignant neoplasm of cervix: Secondary | ICD-10-CM

## 2014-05-09 DIAGNOSIS — R350 Frequency of micturition: Secondary | ICD-10-CM

## 2014-05-09 DIAGNOSIS — Z01419 Encounter for gynecological examination (general) (routine) without abnormal findings: Secondary | ICD-10-CM

## 2014-05-09 DIAGNOSIS — Z1231 Encounter for screening mammogram for malignant neoplasm of breast: Secondary | ICD-10-CM

## 2014-05-09 DIAGNOSIS — Z0000 Encounter for general adult medical examination without abnormal findings: Secondary | ICD-10-CM

## 2014-05-09 LAB — POCT URINALYSIS DIPSTICK
Bilirubin, UA: NEGATIVE
Blood, UA: NEGATIVE
Glucose, UA: NEGATIVE
KETONES UA: NEGATIVE
Leukocytes, UA: NEGATIVE
Nitrite, UA: NEGATIVE
PH UA: 5
PROTEIN UA: NEGATIVE
UROBILINOGEN UA: NEGATIVE

## 2014-05-09 LAB — LIPID PANEL
CHOL/HDL RATIO: 3.3 ratio
CHOLESTEROL: 173 mg/dL (ref 0–200)
HDL: 52 mg/dL (ref >39)
LDL Cholesterol: 106 mg/dL — ABNORMAL HIGH (ref 0–99)
Triglycerides: 73 mg/dL (ref <150)
VLDL: 15 mg/dL (ref 0–40)

## 2014-05-09 LAB — TSH: TSH: 3.097 u[IU]/mL (ref 0.350–4.500)

## 2014-05-09 MED ORDER — LEVOTHYROXINE SODIUM 50 MCG PO TABS: ORAL_TABLET | ORAL | Status: DC

## 2014-05-09 MED ORDER — VALACYCLOVIR HCL 500 MG PO TABS: 500.0000 mg | ORAL_TABLET | Freq: Every day | ORAL | Status: DC

## 2014-05-09 MED ORDER — ESTRADIOL 0.1 MG/GM VA CREA: TOPICAL_CREAM | VAGINAL | Status: DC

## 2014-05-09 NOTE — Addendum Note (Signed)
Addended by: Megan Salon on: 05/09/2014 12:45 PM   Modules accepted: Orders

## 2014-05-09 NOTE — Progress Notes (Addendum)
53 y.o. G1P1 MarriedCaucasianF here for annual exam.  On Prolia.  Will repeat BMD in fall 2016 after two full years of injections.  Having recurrent symptoms of UTI.  Has been treated for UTI two or three times.  Has used OTC products like AZO.  D/W pt using vaginal estrace cream just peri-urethrally with increased frequency.    Patient's last menstrual period was 10/20/2010.          Sexually active: Yes.    The current method of family planning is post menopausal status.    Exercising: Yes.    Gym/ health club routine includes walking and elipitical 3 times a week. Smoker:  no  Health Maintenance: Pap:  8/13 neg HR HPV History of abnormal Pap:  no MMG:  04/02/13 Bi-Rads Neg, Had one this morning Colonoscopy:  01/15, Dr. Collene Mares, WNL BMD:   03/29/12 osteoporosis  TDaP:  01/01/11 Screening Labs: today, Hb today: 13.4  Urine today: neg, ph: 5.0   reports that she has never smoked. She has never used smokeless tobacco. She reports that she does not drink alcohol or use illicit drugs.  Past Medical History  Diagnosis Date  . Melanoma 5/10    basal cell  . Acid reflux   . Migraine   . Anemia   . HSV-2 infection   . Basal cell carcinoma 1/13    Past Surgical History  Procedure Laterality Date  . Laparoscopic cholecystectomy    . Tonsillectomy    . Dental implants      x2  . Lasik    . Gynecologic cryosurgery  1980s  . Mohs surgery      Current Outpatient Prescriptions  Medication Sig Dispense Refill  . denosumab (PROLIA) 60 MG/ML SOLN injection Inject 60 mg into the skin once. Administer in upper arm, thigh, or abdomen 1 Syringe 0  . estradiol (ESTRACE) 0.1 MG/GM vaginal cream 1 gram pv twice weekly or prn 42.5 g 1  . fluticasone (FLONASE) 50 MCG/ACT nasal spray Place 2 sprays into the nose as needed for rhinitis.    Marland Kitchen levothyroxine (SYNTHROID, LEVOTHROID) 50 MCG tablet TAKE 1 TABLET DAILY 90 tablet 4  . pantoprazole (PROTONIX) 40 MG tablet Take 40 mg by mouth daily.    Marland Kitchen  topiramate (TOPAMAX) 100 MG tablet Take 100 mg by mouth daily. Taking 75mg     . valACYclovir (VALTREX) 500 MG tablet Take 1 tablet (500 mg total) by mouth daily. 90 tablet 4   No current facility-administered medications for this visit.    Family History  Problem Relation Age of Onset  . Lung cancer Mother   . Cancer Other     maternal side-lung cancer and melanoma  . Hypertension Paternal Grandfather   . Heart attack Father   . Stroke Other     maternal side  . Bipolar disorder Brother   . Thyroid disease Mother     ROS:  Pertinent items are noted in HPI.  Otherwise, a comprehensive ROS was negative.  Exam:   BP 128/70 mmHg  Pulse 72  Ht 5' 3.75" (1.619 m)  Wt 119 lb (53.978 kg)  BMI 20.59 kg/m2  LMP 10/20/2010   Height: 5' 3.75" (161.9 cm)  Ht Readings from Last 3 Encounters:  05/09/14 5' 3.75" (1.619 m)  01/11/14 5' 3.75" (1.619 m)  04/23/13 5' 3.75" (1.619 m)    General appearance: alert, cooperative and appears stated age Head: Normocephalic, without obvious abnormality, atraumatic Neck: no adenopathy, supple, symmetrical, trachea midline and thyroid  normal to inspection and palpation Lungs: clear to auscultation bilaterally Breasts: normal appearance, no masses or tenderness Heart: regular rate and rhythm Abdomen: soft, non-tender; bowel sounds normal; no masses,  no organomegaly Extremities: extremities normal, atraumatic, no cyanosis or edema Skin: Skin color, texture, turgor normal. No rashes or lesions Lymph nodes: Cervical, supraclavicular, and axillary nodes normal. No abnormal inguinal nodes palpated Neurologic: Grossly normal   Pelvic: External genitalia:  no lesions              Urethra:  normal appearing urethra with no masses, tenderness or lesions              Bartholins and Skenes: normal                 Vagina: normal appearing vagina with normal color and discharge, no lesions              Cervix: no lesions              Pap taken: Yes.    Bimanual Exam:  Uterus:  normal size, contour, position, consistency, mobility, non-tender              Adnexa: normal adnexa and no mass, fullness, tenderness               Rectovaginal: Confirms               Anus:  normal sphincter tone, no lesions  A:  Well Woman with normal exam PMP, no HRT Hypothyroidism H/O fibroids H/O BCC. Sees derm yearly Vaginal dryness Urethritis symptoms/increased frequency sensation Osteoporosis  P: Mammogram today pap smear with neg HR HPV 2013.  Pap today.   Levothyroxine 28mcg daily. #90/4RF. Valtrex 500mg  daily.  #90/4RF. TSH and FLP today Rx to pharmacy for estrace cream On Prolia, due again 1/15 Urine culture return annually or prn  An After Visit Summary was printed and given to the patient.

## 2014-05-10 LAB — URINE CULTURE

## 2014-05-10 LAB — IPS PAP TEST WITH REFLEX TO HPV

## 2014-05-10 LAB — HEMOGLOBIN, FINGERSTICK: Hemoglobin, fingerstick: 13.4 g/dL (ref 12.0–16.0)

## 2014-05-11 ENCOUNTER — Encounter: Payer: Self-pay | Admitting: Obstetrics & Gynecology

## 2014-05-23 ENCOUNTER — Encounter: Payer: Self-pay | Admitting: Obstetrics & Gynecology

## 2014-07-01 ENCOUNTER — Other Ambulatory Visit: Payer: Self-pay | Admitting: *Deleted

## 2014-07-01 ENCOUNTER — Other Ambulatory Visit: Payer: 59

## 2014-07-01 NOTE — Telephone Encounter (Signed)
Incoming fax from [facility] order pharmacy  Medication refill request: Prolia 60 mg Last AEX:  05/09/14 Next AEX: 07/11/15 Last MMG (if hormonal medication request): 05/09/14 [id number]:Neg Refill authorized: 07/03/13 #1syringe/0R. Today #1syringe/0R?

## 2014-07-03 ENCOUNTER — Other Ambulatory Visit (INDEPENDENT_AMBULATORY_CARE_PROVIDER_SITE_OTHER): Payer: 59

## 2014-07-03 DIAGNOSIS — Z01812 Encounter for preprocedural laboratory examination: Secondary | ICD-10-CM

## 2014-07-03 LAB — CALCIUM: CALCIUM: 9.3 mg/dL (ref 8.4–10.5)

## 2014-07-03 MED ORDER — DENOSUMAB 60 MG/ML ~~LOC~~ SOLN: 60.0000 mg | Freq: Once | SUBCUTANEOUS | Status: DC

## 2014-07-04 ENCOUNTER — Other Ambulatory Visit: Payer: Self-pay | Admitting: Dermatology

## 2014-07-04 ENCOUNTER — Telehealth: Payer: Self-pay | Admitting: *Deleted

## 2014-07-04 NOTE — Telephone Encounter (Signed)
Incoming fax from [facility] wanting to clarify directions for Prolia Injection that was sent 07/03/14 to [facility] and was transferred to [facility] and s/w pharmacist Bobbe Medico. They just wanted to clarify prescription.  Confirmed Prolia 60 mg injection  Inject 60 mg into the skin once, Administer in upper arm, thigh, or abdomen #1 syringe with no refills.  Pharmacist confirmed as soon as they get a verbal from the patient the injection will be sent to our office.  Routed to provider for review, encounter closed.

## 2014-07-10 ENCOUNTER — Telehealth: Payer: Self-pay | Admitting: Obstetrics & Gynecology

## 2014-07-10 NOTE — Telephone Encounter (Signed)
Pt says she is supposed to get a prolia shot on Monday but company has been trying to get in touch with [place] to deliver the shot.

## 2014-07-10 NOTE — Telephone Encounter (Signed)
Scheduled injection for delivery for Tuesday 07/16/14. Delivery cannot be made on Monday 07/15/14. Friday 07/12/14, potential for weather concerns, Medication must be refrigerated and signed for.  Patient aware, she will call us to schedule nurse appointment for prolia injection.   Routing to provider for final review. Patient agreeable to disposition. Will close encounter

## 2014-07-15 ENCOUNTER — Ambulatory Visit: Payer: 59

## 2014-07-17 ENCOUNTER — Telehealth: Payer: Self-pay | Admitting: Obstetrics & Gynecology

## 2014-07-17 NOTE — Telephone Encounter (Signed)
Left message to call Delawrence Fridman at [phone]. 

## 2014-07-17 NOTE — Telephone Encounter (Signed)
Patient is asking to talk with Olivia Mackie regarding Prolia. Last seen 07/03/14.

## 2014-07-18 NOTE — Telephone Encounter (Signed)
Spoke with patient. Advised Prolia was delivered yesterday. Patient would like to schedule nurse appointment at this time. Appointment scheduled for tomorrow 1/29 at 8:30am. Agreeable to date and time.  Routing to provider for final review. Patient agreeable to disposition. Will close encounter

## 2014-07-19 ENCOUNTER — Ambulatory Visit (INDEPENDENT_AMBULATORY_CARE_PROVIDER_SITE_OTHER): Payer: 59 | Admitting: *Deleted

## 2014-07-19 VITALS — BP 110/70 | HR 80 | Resp 16 | Ht 63.75 in | Wt 120.0 lb

## 2014-07-19 DIAGNOSIS — M810 Age-related osteoporosis without current pathological fracture: Secondary | ICD-10-CM

## 2014-07-19 MED ORDER — DENOSUMAB 60 MG/ML ~~LOC~~ SOLN
60.0000 mg | Freq: Once | SUBCUTANEOUS | Status: AC
  Administered 2014-07-19: 60 mg via SUBCUTANEOUS

## 2014-07-19 NOTE — Progress Notes (Signed)
Patient in today for Prolia injection. Patient tolerated shot well and denies any problems with injections in the past.  Advised pt next Prolia injection on 12/2014. Call a few weeks ahead to check calcium levels. - Pt verbalized understanding.

## 2014-12-02 ENCOUNTER — Other Ambulatory Visit: Payer: Self-pay | Admitting: *Deleted

## 2014-12-02 MED ORDER — DENOSUMAB 60 MG/ML ~~LOC~~ SOLN: 60.0000 mg | Freq: Once | SUBCUTANEOUS | Status: DC

## 2014-12-02 NOTE — Telephone Encounter (Signed)
Medication refill request: Prolia 60 MG Last AEX:  05-09-14 Next AEX: 07-11-15  Last MMG (if hormonal medication request): 05-09-14 WNL DEXA: 03-29-12  Refill authorized: please advise

## 2014-12-06 ENCOUNTER — Encounter: Payer: Self-pay | Admitting: Gastroenterology

## 2015-01-13 ENCOUNTER — Telehealth: Payer: Self-pay | Admitting: Emergency Medicine

## 2015-01-13 ENCOUNTER — Encounter: Payer: Self-pay | Admitting: Obstetrics & Gynecology

## 2015-01-13 MED ORDER — ALPRAZOLAM 0.5 MG PO TABS: ORAL_TABLET | ORAL | Status: DC

## 2015-01-13 MED ORDER — ALPRAZOLAM 0.5 MG PO TABS: 0.5000 mg | ORAL_TABLET | Freq: Every evening | ORAL | Status: DC | PRN

## 2015-01-13 NOTE — Telephone Encounter (Signed)
Megan Salon, MD at 01/13/2015 4:19 PM    Status: Signed      Expand All Collapse All    rx for xanax printed. 0.5mg  1/2 to 1 tab as needed every 8 hrs. First rx done as [facility] so this was edited and new rx done.

## 2015-01-13 NOTE — Telephone Encounter (Signed)
Non-Urgent Medical Question  Message [id number]   From KAMBREY HAGGER   To Megan Salon, MD   Sent 01/13/2015 3:55 PM   Hi Dr. Sabra Heck,   I'm having a problem with itching all over. It just happens randomly - I'll start itching on my arms, legs, body, head ... One of my co-workers suggested it may be my nerves. My son and daughter-in-law just had twins at 29 weeks. She had been on bed rest since week 17 due to a ruptured sac. The twin in the ruptured sac passed away after living 11 hours. The second baby is in the NICU at [place]. He weighs 1 lb 14 ozs. Two boys! He is in critical condition. My daughter-in-law is 40. I feel like I need something to help calm my nerves. Can you call something in for me? [facility] [address] [facility] add [facility] to your prayer list  Thanks,  Jaclyn Alexander    Responsible Party

## 2015-01-13 NOTE — Telephone Encounter (Signed)
rx for xanax printed.  0.5mg  1/2 to 1 tab as needed every 8 hrs.  First rx done as QHS so this was edited and new rx done.

## 2015-01-13 NOTE — Telephone Encounter (Signed)
Call to patient. Advised Dr. Sabra Heck in receipt of her mychart message. Advised short term prescription for Xanax [facility]. Advised quick acting anti-anxiety medication. Advised to start with taking 1/2 tablet q 6-8 hours as needed for anxiety. Advised to start with half tablet and increase as needed, advised may cause drowsiness so take care with driving on medication. Patient has used this medication about two years ago and worked well for her. She is advised to call back as needed or if symptoms persist. Patient expresses thanks for quick follow up from Dr. Sabra Heck. Routing to provider for final review. Patient agreeable to disposition. Will close encounter.

## 2015-04-03 ENCOUNTER — Encounter: Payer: Self-pay | Admitting: Obstetrics & Gynecology

## 2015-04-04 ENCOUNTER — Telehealth: Payer: Self-pay | Admitting: Emergency Medicine

## 2015-04-04 NOTE — Telephone Encounter (Signed)
Let's do the DEXA first and see if the Prolia has helped and if another dosage is even needed right now.  We should be able to get this done and the prolia before the end of the year, if she ends up needing another prolia injection.

## 2015-04-04 NOTE — Telephone Encounter (Signed)
Dr. Sabra Heck,  Patient has had two Prolia injections.  Last Dexa on file 03/29/12.   Do you want her to have another DEXA before planning Prolia?

## 2015-04-04 NOTE — Telephone Encounter (Signed)
Chief Complaint  Patient presents with  . Advice Only    Patient sent mychart message     ===View-only below this line===   ----- Message -----    From: [name]    Sent: 04/03/2015 10:10 PM EDT      To: Lyman Speller, MD Subject: Non-Urgent Medical Question  Hi Dr Sabra Heck,  I missed my 2nd prolia shot in July. Is it too late to get it? We are going to a high deductible health insurance plan at work in January so it may be more cost effective for me to get it before December 31st if you think it's ok. Last year when I saw you, I couldn't get a follow up appt with you until Jan. 2017 so that's why I'm asking before the end of the year. Hope you're doing well. My little grandson is still in the hospital. He is up to 4 lbs 8 ozs. Thanks for your time - I look forward to hearing from you. Jaclyn Alexander

## 2015-04-07 NOTE — Telephone Encounter (Signed)
Order for bone density sent to [facility] with fax confirmation received. Routing to provider for final review. Patient agreeable to disposition. Will close encounter.

## 2015-04-07 NOTE — Telephone Encounter (Signed)
Call to patient and message from Dr. Sabra Heck given.  Patient will call [facility] and schedule bone density and will coordinate based on results.  Order to Dr. Quincy Simmonds to sign for bone density as covering.

## 2015-04-09 ENCOUNTER — Other Ambulatory Visit: Payer: Self-pay

## 2015-04-09 DIAGNOSIS — Z1231 Encounter for screening mammogram for malignant neoplasm of breast: Secondary | ICD-10-CM

## 2015-04-11 ENCOUNTER — Encounter: Payer: Self-pay | Admitting: Obstetrics & Gynecology

## 2015-04-11 ENCOUNTER — Telehealth: Payer: Self-pay

## 2015-04-11 DIAGNOSIS — N950 Postmenopausal bleeding: Secondary | ICD-10-CM

## 2015-04-11 NOTE — Telephone Encounter (Signed)
Spoke with patient. Patient states that she had spotting on Wednesday, nothing on Thursday, and spotting again today. States this has not occurred before. Advised she will need to be seen in office for further evaluation. Patient is agreeable. Appointment scheduled for Monday 04/14/2015 at 2:30 pm with Dr.Miller. Patient is agreeable to appointment date and time. Will speak with Dr.Miller at the time of her appointment to see if she has received her BMD.  Routing to provider for final review. Patient agreeable to disposition. Will close encounter.

## 2015-04-11 NOTE — Telephone Encounter (Signed)
Patient missed her Prolia injection in July. Recommended to have a bone density prior to proceeding with another Prolia injection. Patient sent mychart message below to notify Dr.Miller of spotting and to see if she has received BMD results yet. Routing to [facility] for review.

## 2015-04-11 NOTE — Telephone Encounter (Signed)
Non-Urgent Medical Question  Message [id number]   From  MERAL GEISSINGER   To  Megan Salon, MD   Sent  04/11/2015 4:32 PM     Hi Dr Sabra Heck,   Just wanted to mention to you that I started spotting again. On Wednesday, I spotted some and it was very light, nothing on Thursday but today I am spotting again. Also, wanted to check to see if you have heard from my bone density scan.   Thanks,  Blain Pais      Responsible Party    Pool - [facility] Clinical Pool No one has taken responsibility for this message.     No actions have been taken on this message.

## 2015-04-12 NOTE — Telephone Encounter (Signed)
Pt needs a sonohysterogram and possible biopsy.  Can you change appt?  I haven't gotten BMD yet, just FYI.

## 2015-04-14 ENCOUNTER — Ambulatory Visit: Payer: 59 | Admitting: Obstetrics & Gynecology

## 2015-04-14 ENCOUNTER — Telehealth: Payer: Self-pay | Admitting: Obstetrics & Gynecology

## 2015-04-14 NOTE — Addendum Note (Signed)
Addended by: Michele Mcalpine on: 04/14/2015 08:42 AM   Modules accepted: Orders

## 2015-04-14 NOTE — Telephone Encounter (Signed)
Spoke with patient. Reviewed benefit for sonohysterogram/endometrail biopsy scheduled 04/17/15. Patient understands and agreeable. Verified arrival date/time. Patient agreeable. Ok to close.

## 2015-04-14 NOTE — Telephone Encounter (Signed)
Called patient and she is agreeable to change appointment to Thursday 04/17/15 at 1500 for Sonohysterogram and possible Endometrial biopsy.  She is given instructions and advised that she will be contacted with insurance information regarding procedure. She is advised to return call with any new bleeding or concerns.   Bone Density results requested from [facility] and placed in Dr. Sabra Heck inbox.  Orders placed for procedures.  Encounter is closed.  cc Kerry Hough for insurance pre-certification and patient contact.

## 2015-04-17 ENCOUNTER — Ambulatory Visit (INDEPENDENT_AMBULATORY_CARE_PROVIDER_SITE_OTHER): Payer: 59 | Admitting: Obstetrics & Gynecology

## 2015-04-17 ENCOUNTER — Ambulatory Visit (INDEPENDENT_AMBULATORY_CARE_PROVIDER_SITE_OTHER): Payer: 59

## 2015-04-17 ENCOUNTER — Other Ambulatory Visit: Payer: Self-pay | Admitting: Obstetrics & Gynecology

## 2015-04-17 VITALS — BP 134/84 | HR 72 | Resp 16 | Wt 119.0 lb

## 2015-04-17 DIAGNOSIS — N950 Postmenopausal bleeding: Secondary | ICD-10-CM

## 2015-04-17 DIAGNOSIS — M810 Age-related osteoporosis without current pathological fracture: Secondary | ICD-10-CM

## 2015-04-17 LAB — CALCIUM: Calcium: 9.7 mg/dL (ref 8.6–10.4)

## 2015-04-17 NOTE — Progress Notes (Signed)
54 y.o. Jaclyn Alexander here for a pelvic ultrasound with sonohystogram due to postmenopausal bleeding and h/o uterine fibroids.  Pt had some light spotting starting 04/10/15.    Pt also here to discuss recent BMD.  She is overdue for Prolia injection.  Pt didn't do it this summer as she had twin grandsons born at 20 weeks.  One died but the other is doing well.  He's still in the hospital and weighs over five pounds.  He will hopefully be home by Thanksgiving.  There was just too much going on for her to get her Prolia injection at that time.  BMD was reviewed with her.  Both hip and spine t scores have improved.  T score in left hip improved from -3.1 to -2.8 and spine improved from -2.3 to -1.8.  Pt is going to get one more Prolia injection to complete 4.  She knows her insurance is changing and may not have coverage so may end up doing a drug holiday and just repeating BMD in two years.  We will precert this next just and check coverage.  Will decide then whether to stop or not.  Patient's last menstrual period was 10/20/2010.  Contraception: PMP State  Technique:  Both transabdominal and transvaginal ultrasound examinations of the pelvis were performed. Transabdominal technique was performed for global imaging of the pelvis including uterus, ovaries, adnexal regions, and pelvic cul-de-sac.  It was necessary to proceed with endovaginal exam following the abdominal ultrasound transabdominal exam to visualize the endometrium and adnexa.  Color and duplex Doppler ultrasound was utilized to evaluate blood flow to the ovaries.   FINDINGS: Uterus: 5.6 x 4.4 x 3.5cm with multiple intramural and one submucosal fibroids, calcified.  Fibroids measure 1.5 x 1.4cm, 3.0 x 2.6cm, 2.1 x 1.2cm, 1.2 x 1.2cm.  Fibroids are all slightly smaller than prior ultrasound. Endometrium: distorted due to fibroid Adnexa:  Left:  1.7 x 0.8 x 0.7cm     Right: 1.9 x 1.1 x 1.0cm  Cul de sac: no free fluid  SHSG:  After  obtaining appropriate verbal consent from patient, the cervix was visualized using a speculum, and prepped with betadine.  A tenaculum  was applied to the cervix.  Dilation of the cervix was not necessary. The catheter was passed into the uterus and sterile saline introduced, with the following findings:  Submucosal fibroid 28 x11mm.  This is decreased in size from prior ultrasound.  Endometrial biopsy recommended. Verbal and written consent obtained.  Speculum placed.  Cervix visualized and cleansed with betadine prep.  A single toothed tenaculum was applied to the anterior lip of the cervix.  Endometrial pipelle was advanced through the cervix into the endometrial cavity without difficulty.  Pipelle passed to 7cm.  Suction applied and pipelle removed with good tissue sample obtained.  Tenculum removed.  No bleeding noted.  Patient tolerated procedure well.  All instruments removed.  Assessment:  PMP bleeding in pt with calcified, submucosal and intramural fibroids Osteoporosis  Plan:  Endometrial biopsy is pending.  If negative, pt will desire to continue monitoring. Have advised is she continues to have bleeding, most likely recommendation will be hysterectomy due to amount of calcifications in the fibroid and associated difficulties with hysteroscopic resection.  All questions answered.  Will proceed with forth Prolia injection and then determine her coverage for next year.  If coverage is not good, ok to do drug holiday and repeat BMD in 2 years.  ~25 minutes spent with patient >50% of time  was in face to face discussion of above.

## 2015-04-21 ENCOUNTER — Other Ambulatory Visit: Payer: Self-pay | Admitting: Emergency Medicine

## 2015-04-21 MED ORDER — DENOSUMAB 60 MG/ML ~~LOC~~ SOLN: 60.0000 mg | Freq: Once | SUBCUTANEOUS | Status: DC

## 2015-04-22 ENCOUNTER — Telehealth: Payer: Self-pay | Admitting: Emergency Medicine

## 2015-04-22 NOTE — Telephone Encounter (Signed)
Call to [facility] and spoke with representative. They have confirmed that they have received order and are reaching out to patient to request authorization for shipment.

## 2015-04-22 NOTE — Telephone Encounter (Signed)
[facility] has left message for patient to call to authorize shipment.

## 2015-04-23 ENCOUNTER — Encounter: Payer: Self-pay | Admitting: Obstetrics & Gynecology

## 2015-04-25 ENCOUNTER — Encounter: Payer: Self-pay | Admitting: Obstetrics & Gynecology

## 2015-04-28 ENCOUNTER — Telehealth: Payer: Self-pay | Admitting: Obstetrics & Gynecology

## 2015-04-28 ENCOUNTER — Telehealth: Payer: Self-pay | Admitting: Emergency Medicine

## 2015-04-28 NOTE — Telephone Encounter (Signed)
Spoke with [facility] authorization for shipment of Prolia 60 mg/ml given. Prolia will be delivered to the office on 05/01/2015. Patient will need to be scheduled for Prolia injection after we have received shipment.  Routing to provider for final review. Patient agreeable to disposition. Will close encounter.

## 2015-04-28 NOTE — Telephone Encounter (Signed)
Patient notified of results by Dr. Sabra Heck via my chart. Patient given additional message from dr. Sabra Heck as below. Denies bleeding at this time. Patient will call back with any further concerns.  Routing to provider for final review. Patient agreeable to disposition. Will close encounter.

## 2015-04-28 NOTE — Telephone Encounter (Signed)
[facility] pharmacy calling wanting to schedule delivery of Prolia Injection. Pharmacy # [phone]

## 2015-04-28 NOTE — Telephone Encounter (Deleted)
-----   Message from Megan Salon, MD sent at 04/24/2015  4:51 PM EDT ----- Please inform pt her endometrial biopsy showed benign findings.  She knows to call if has future bleeding.  She has a large submucosal fibroid that has caused bleeding in the past.  Nothing else is needed now as long as her bleeding has stopped.

## 2015-04-28 NOTE — Telephone Encounter (Signed)
-----   Message from Megan Salon, MD sent at 04/24/2015  4:51 PM EDT ----- Please inform pt her endometrial biopsy showed benign findings.  She knows to call if has future bleeding.  She has a large submucosal fibroid that has caused bleeding in the past.  Nothing else is needed now as long as her bleeding has stopped.

## 2015-05-01 ENCOUNTER — Telehealth: Payer: Self-pay | Admitting: Emergency Medicine

## 2015-05-01 NOTE — Telephone Encounter (Signed)
Prolia is here for patient.   Calling patient to schedule nurse visit.  Scheduled for 05/07/15 at 0830.  Routing to provider for final review. Patient agreeable to disposition. Will close encounter.

## 2015-05-02 ENCOUNTER — Encounter: Payer: Self-pay | Admitting: Obstetrics & Gynecology

## 2015-05-07 ENCOUNTER — Ambulatory Visit (INDEPENDENT_AMBULATORY_CARE_PROVIDER_SITE_OTHER): Payer: 59

## 2015-05-07 VITALS — BP 108/74 | HR 78 | Resp 18 | Ht 63.75 in | Wt 120.0 lb

## 2015-05-07 DIAGNOSIS — M810 Age-related osteoporosis without current pathological fracture: Secondary | ICD-10-CM | POA: Diagnosis not present

## 2015-05-07 MED ORDER — DENOSUMAB 60 MG/ML ~~LOC~~ SOLN
60.0000 mg | Freq: Once | SUBCUTANEOUS | Status: AC
  Administered 2015-05-07: 60 mg via SUBCUTANEOUS

## 2015-05-07 NOTE — Progress Notes (Signed)
Pt in today for Prolia injection. Pt tolerated well and denies any problems or concerns with injections in the past. Advised pt of next injection. Pt Verbalized understanding.

## 2015-05-21 ENCOUNTER — Ambulatory Visit
Admission: RE | Admit: 2015-05-21 | Discharge: 2015-05-21 | Disposition: A | Discharge status: Home / Self Care | Payer: Commercial Managed Care - PPO | Source: Ambulatory Visit

## 2015-05-21 DIAGNOSIS — Z1231 Encounter for screening mammogram for malignant neoplasm of breast: Secondary | ICD-10-CM

## 2015-05-23 ENCOUNTER — Other Ambulatory Visit: Payer: Self-pay | Admitting: Obstetrics & Gynecology

## 2015-05-23 DIAGNOSIS — R928 Other abnormal and inconclusive findings on diagnostic imaging of breast: Secondary | ICD-10-CM

## 2015-05-27 ENCOUNTER — Ambulatory Visit
Admission: RE | Admit: 2015-05-27 | Discharge: 2015-05-27 | Disposition: A | Discharge status: Home / Self Care | Payer: Commercial Managed Care - PPO | Source: Ambulatory Visit | Attending: Obstetrics & Gynecology | Admitting: Obstetrics & Gynecology

## 2015-05-27 DIAGNOSIS — R928 Other abnormal and inconclusive findings on diagnostic imaging of breast: Secondary | ICD-10-CM

## 2015-07-11 ENCOUNTER — Ambulatory Visit (INDEPENDENT_AMBULATORY_CARE_PROVIDER_SITE_OTHER): Payer: Commercial Managed Care - PPO | Admitting: Obstetrics & Gynecology

## 2015-07-11 ENCOUNTER — Encounter: Payer: Self-pay | Admitting: Obstetrics & Gynecology

## 2015-07-11 VITALS — BP 126/86 | HR 76 | Resp 16 | Ht 63.25 in | Wt 120.0 lb

## 2015-07-11 DIAGNOSIS — F411 Generalized anxiety disorder: Secondary | ICD-10-CM | POA: Diagnosis not present

## 2015-07-11 DIAGNOSIS — Z01419 Encounter for gynecological examination (general) (routine) without abnormal findings: Secondary | ICD-10-CM | POA: Diagnosis not present

## 2015-07-11 DIAGNOSIS — M810 Age-related osteoporosis without current pathological fracture: Secondary | ICD-10-CM

## 2015-07-11 DIAGNOSIS — Z658 Other specified problems related to psychosocial circumstances: Secondary | ICD-10-CM | POA: Diagnosis not present

## 2015-07-11 DIAGNOSIS — L299 Pruritus, unspecified: Secondary | ICD-10-CM | POA: Diagnosis not present

## 2015-07-11 DIAGNOSIS — Z0000 Encounter for general adult medical examination without abnormal findings: Secondary | ICD-10-CM | POA: Diagnosis not present

## 2015-07-11 LAB — COMPREHENSIVE METABOLIC PANEL
ALK PHOS: 41 U/L (ref 33–130)
ALT: 12 U/L (ref 6–29)
AST: 15 U/L (ref 10–35)
Albumin: 4.5 g/dL (ref 3.6–5.1)
BILIRUBIN TOTAL: 1.2 mg/dL (ref 0.2–1.2)
BUN: 25 mg/dL (ref 7–25)
CALCIUM: 9.3 mg/dL (ref 8.6–10.4)
CHLORIDE: 109 mmol/L (ref 98–110)
CO2: 23 mmol/L (ref 20–31)
CREATININE: 0.84 mg/dL (ref 0.50–1.05)
GLUCOSE: 87 mg/dL (ref 65–99)
POTASSIUM: 4.3 mmol/L (ref 3.5–5.3)
Sodium: 143 mmol/L (ref 135–146)
Total Protein: 7.1 g/dL (ref 6.1–8.1)

## 2015-07-11 LAB — HEPATIC FUNCTION PANEL
ALBUMIN: 4.5 g/dL (ref 3.6–5.1)
ALT: 12 U/L (ref 6–29)
AST: 15 U/L (ref 10–35)
Alkaline Phosphatase: 41 U/L (ref 33–130)
BILIRUBIN DIRECT: 0.2 mg/dL (ref <=0.2)
BILIRUBIN TOTAL: 1.2 mg/dL (ref 0.2–1.2)
Indirect Bilirubin: 1 mg/dL (ref 0.2–1.2)
Total Protein: 7.1 g/dL (ref 6.1–8.1)

## 2015-07-11 LAB — POCT URINALYSIS DIPSTICK
Bilirubin, UA: NEGATIVE
Glucose, UA: NEGATIVE
KETONES UA: NEGATIVE
NITRITE UA: NEGATIVE
PH UA: 7
PROTEIN UA: NEGATIVE
RBC UA: NEGATIVE
Urobilinogen, UA: NEGATIVE

## 2015-07-11 LAB — CBC WITH DIFFERENTIAL/PLATELET
BASOS PCT: 1 % (ref 0–1)
Basophils Absolute: 0.1 10*3/uL (ref 0.0–0.1)
EOS ABS: 0.2 10*3/uL (ref 0.0–0.7)
EOS PCT: 3 % (ref 0–5)
HCT: 41.2 % (ref 36.0–46.0)
Hemoglobin: 13.5 g/dL (ref 12.0–15.0)
Lymphocytes Relative: 36 % (ref 12–46)
Lymphs Abs: 2.3 10*3/uL (ref 0.7–4.0)
MCH: 31 pg (ref 26.0–34.0)
MCHC: 32.8 g/dL (ref 30.0–36.0)
MCV: 94.7 fL (ref 78.0–100.0)
MONO ABS: 0.7 10*3/uL (ref 0.1–1.0)
MONOS PCT: 11 % (ref 3–12)
MPV: 9.7 fL (ref 8.6–12.4)
NEUTROS ABS: 3.1 10*3/uL (ref 1.7–7.7)
Neutrophils Relative %: 49 % (ref 43–77)
PLATELETS: 295 10*3/uL (ref 150–400)
RBC: 4.35 MIL/uL (ref 3.87–5.11)
RDW: 13.3 % (ref 11.5–15.5)
WBC: 6.3 10*3/uL (ref 4.0–10.5)

## 2015-07-11 LAB — HEMOGLOBIN, FINGERSTICK: Hemoglobin, fingerstick: 12.9 g/dL (ref 12.0–16.0)

## 2015-07-11 LAB — TSH: TSH: 2.54 u[IU]/mL (ref 0.350–4.500)

## 2015-07-11 MED ORDER — CITALOPRAM HYDROBROMIDE 10 MG PO TABS: 20.0000 mg | ORAL_TABLET | Freq: Every day | ORAL | Status: DC

## 2015-07-11 MED ORDER — PANTOPRAZOLE SODIUM 40 MG PO TBEC: 40.0000 mg | DELAYED_RELEASE_TABLET | Freq: Every day | ORAL | Status: DC

## 2015-07-11 MED ORDER — LEVOTHYROXINE SODIUM 50 MCG PO TABS: ORAL_TABLET | ORAL | Status: DC

## 2015-07-11 MED ORDER — VALACYCLOVIR HCL 500 MG PO TABS: 500.0000 mg | ORAL_TABLET | Freq: Every day | ORAL | Status: DC

## 2015-07-11 MED ORDER — ESTRADIOL 0.1 MG/GM VA CREA: TOPICAL_CREAM | VAGINAL | Status: DC

## 2015-07-11 NOTE — Progress Notes (Signed)
55 y.o. G1P1 MarriedCaucasianF here for annual exam.  Reports she is really under a lot of stress and wants to discuss this today.  Also reports she is still having some skin itching.  Saw Dr. Tonia Brooms who told her she didn't really believe in psychological issues causing itching so she needed blood work, specifically to r/o lymphoma.  This has really make the pt anxious as well.  Has Xanax that she asked for when her grandson was born but isn't really taking it.  Concerned about the possibility of addiction.  She would like to consider other options.  SSRIs discussed for general anxiety d/o.  I feel a lot of this is situational for pt and that she won't need to be on this long term.  She likes that idea.  Risks and side effects discussed, specifically nausea in the first week and risk of serotonin syndrome.  Pt willing to try medication.  Premature grandson who was hospitalized from July to early December is doing well.  He weighs 9 pounds.  Is doing well developmentally.  He was hospitalized for about a week in early January for URI.  It was not RSV.  Pt reports they bought land and are building a house on [address] to be near grandson and family.  They are supposed to move next week because they've sold their home and it closes the first week in February.  Floors in their new home aren't done yet.  Pt very anxious about all of the moving parts of their life.  She will plan to keep her job but will start commuting to [employer].  This will be a 30-35 minute commute daily.  No other bleeding this year.  H/O fibroids.     Patient's last menstrual period was 10/20/2010.          Sexually active: Yes.    The current method of family planning is post menopausal status.    Exercising: Yes.    Walking Smoker:  no  Health Maintenance: Pap:  05/09/14 Neg, 8/13 neg pap with neg HR HPV History of abnormal Pap:  yes MMG:  05/27/15 BIRADS1:neg Colonoscopy:  11/20/08 Normal - repeat 10 years  BMD:   04/09/15  Osteoporosis, has received Prolia 1/15, 7/15, 1/16, and and 12/16.   TDaP:  2012  Screening Labs: Here, Hb today: 12.9, Urine today: WBC=Trace   reports that she has never smoked. She has never used smokeless tobacco. She reports that she does not drink alcohol or use illicit drugs.  Past Medical History  Diagnosis Date  . Melanoma ([place]) 5/10    basal cell  . Acid reflux   . Migraine   . Anemia   . HSV-2 infection   . Basal cell carcinoma 1/13  . Osteoporosis     Past Surgical History  Procedure Laterality Date  . Laparoscopic cholecystectomy    . Tonsillectomy    . Dental implants      x2  . Lasik    . Gynecologic cryosurgery  1980s  . Mohs surgery      Current Outpatient Prescriptions  Medication Sig Dispense Refill  . calcium citrate-vitamin D (CITRACAL+D) 315-200 MG-UNIT tablet Take by mouth.    . denosumab (PROLIA) 60 MG/ML SOLN injection Inject 60 mg into the skin once. Administer in upper arm, thigh, or abdomen 1 Syringe 0  . estradiol (ESTRACE) 0.1 MG/GM vaginal cream 1 gram pv twice weekly or prn 42.5 g 1  . fluticasone (FLONASE) 50 MCG/ACT nasal spray  Place 2 sprays into the nose as needed for rhinitis.    Marland Kitchen levothyroxine (SYNTHROID, LEVOTHROID) 50 MCG tablet TAKE 1 TABLET DAILY 90 tablet 4  . Magnesium 500 MG CAPS Take by mouth daily.    . metaxalone (SKELAXIN) 800 MG tablet Take by mouth.    . pantoprazole (PROTONIX) 40 MG tablet Take 40 mg by mouth daily.    Marland Kitchen topiramate (TOPAMAX) 50 MG tablet Take 125 mg by mouth daily.     . valACYclovir (VALTREX) 500 MG tablet Take 1 tablet (500 mg total) by mouth daily. 90 tablet 4  . ALPRAZolam (XANAX) 0.5 MG tablet Take 1/2 to 1 tab as needed (Patient not taking: Reported on 07/11/2015) 30 tablet 0   No current facility-administered medications for this visit.    Family History  Problem Relation Age of Onset  . Lung cancer Mother   . Cancer Other     maternal side-lung cancer and melanoma  . Hypertension Paternal  Grandfather   . Heart attack Father   . Stroke Other     maternal side  . Bipolar disorder Brother   . Thyroid disease Mother     ROS:  Pertinent items are noted in HPI.  Otherwise, a comprehensive ROS was negative.  Exam:   BP 126/86 mmHg  Pulse 76  Resp 16  Ht 5' 3.25" (1.607 m)  Wt 120 lb (54.432 kg)  BMI 21.08 kg/m2  LMP 10/20/2010  Weight change: +1#  Height: 5' 3.25" (160.7 cm)  Ht Readings from Last 3 Encounters:  07/11/15 5' 3.25" (1.607 m)  05/07/15 5' 3.75" (1.619 m)  07/19/14 5' 3.75" (1.619 m)    General appearance: alert, cooperative and appears stated age Head: Normocephalic, without obvious abnormality, atraumatic Neck: no adenopathy, supple, symmetrical, trachea midline and thyroid normal to inspection and palpation Lungs: clear to auscultation bilaterally Breasts: normal appearance, no masses or tenderness Heart: regular rate and rhythm Abdomen: soft, non-tender; bowel sounds normal; no masses,  no organomegaly Extremities: extremities normal, atraumatic, no cyanosis or edema Skin: Skin color, texture, turgor normal. No rashes or lesions Lymph nodes: Cervical, supraclavicular, and axillary nodes normal. No abnormal inguinal nodes palpated Neurologic: Grossly normal   Pelvic: External genitalia:  no lesions              Urethra:  normal appearing urethra with no masses, tenderness or lesions              Bartholins and Skenes: normal                 Vagina: normal appearing vagina with normal color and discharge, no lesions              Cervix: no lesions              Pap taken: No. Bimanual Exam:  Uterus:  enlarged, 8 weeks, globular consistent with fibroids weeks size              Adnexa: normal adnexa               Rectovaginal: Confirms               Anus:  normal sphincter tone, no lesions  Chaperone was present for exam.  A:  Well Woman with normal exam PMP, no HRT Hypothyroidism H/O fibroids with episode of PMP bleeding this year with  negative endometrial biopsy H/O BCC. Sees derm yearly Vaginal dryness,, on vaginal estrogen therapy H/O HSV Recent increased stressors Itching  that has been evaluated by dermatology  Osteoporosis Anxiety  P:   Mammogram yearly Neg pap 11/15 and neg pap with neg HR HPV 2013.  No pap today. On Prolia.  Injections dons 1/15, 7/15, 1/16, and and 12/16.  BMD 10/16 with improved but still present osteoporosis.  Will see insurance coverage for Prolia in June.  If cost too high, will do drug holiday. Valtrex 589m daily.  #90/4RF to mail order. Estrace vaginal cream 1 gm pv twice weekly.  #42.5gm tube/3RF Synthroid 568m po q day.  #90/4RF to mail order. Start Citalopram 1033m #30/2RF to local pharmacy.  Pt will give update in 2 weeks.   CBC with diff, TSH, CMP, ESR, and Hepatic panel today.  (Reveiwed Up to Date recommendations regarding laboratory evaluation for itching.) AEX 1 year or follow up prn  In addition to gynecological concerns and AEX, another 15 minutes was spent discussing stressors, anxiety, treatment options as well as evaluation for itching.

## 2015-07-12 LAB — SEDIMENTATION RATE: SED RATE: 1 mm/h (ref 0–30)

## 2015-07-15 ENCOUNTER — Telehealth: Payer: Self-pay | Admitting: Obstetrics & Gynecology

## 2015-07-15 MED ORDER — ESTRADIOL 0.1 MG/GM VA CREA: TOPICAL_CREAM | VAGINAL | Status: DC

## 2015-07-15 MED ORDER — LEVOTHYROXINE SODIUM 50 MCG PO TABS: ORAL_TABLET | ORAL | Status: DC

## 2015-07-15 NOTE — Telephone Encounter (Signed)
[facility] requesting refills of estradiol (ESTRACE) 0.1 MG/GM vaginal cream and levothyroxine (SYNTHROID, LEVOTHROID) 50 MCG tablet. Ph: [phone] ref: [id number]

## 2015-07-17 ENCOUNTER — Telehealth: Payer: Self-pay | Admitting: Emergency Medicine

## 2015-07-17 NOTE — Telephone Encounter (Signed)
-----   Message from Megan Salon, MD sent at 07/14/2015 10:24 AM EST ----- I informed Jaclyn Alexander of test results.  She needs a chest x ray, per Dr. Marisue Ivan recommendations.  She lives in [place].  Can you figure out what is the best place in [place] for her to have a chest x ray and what orders are needed?  Thanks.

## 2015-07-17 NOTE — Telephone Encounter (Signed)
Message left to return call to [facility] at [phone].  To set up chest xray.

## 2015-07-17 NOTE — Telephone Encounter (Signed)
Patient notified of need for chest xray. She will have it completed at [facility] will call her when order is placed.

## 2015-07-18 NOTE — Telephone Encounter (Signed)
Called [facility] outpatient imaging and spoke with Marita [place], she confirms order is received and patient can walk in for imaging on outpatient basis.   Notifed patient. She will let us know when xray is complete so that we can ensure Dr. Sabra Heck receives results.   Routing to provider for final review. Patient agreeable to disposition. Will close encounter.

## 2015-07-24 ENCOUNTER — Telehealth: Payer: Self-pay | Admitting: Obstetrics & Gynecology

## 2015-07-24 NOTE — Telephone Encounter (Signed)
Patient called to report she had a chest x-ray done today at [facility]. She said, "I was told to call so Dr. Sabra Heck could watch for the results."

## 2015-07-25 NOTE — Telephone Encounter (Signed)
Result is received and in results folder for Dr. Sabra Heck.

## 2015-07-31 ENCOUNTER — Encounter: Payer: Self-pay | Admitting: Obstetrics & Gynecology

## 2015-07-31 NOTE — Telephone Encounter (Signed)
Responded to patient via mychart

## 2015-07-31 NOTE — Telephone Encounter (Signed)
Patient sent mychart message: I responded to patient via mychart and advised will send message to Dr. Sabra Heck and return call with any instructions. Any additional instructions or follow up for itching? ----- Message ----- From: Jaclyn Alexander Sent: 07/31/2015 8:36 AM EST To: Lyman Speller, MD Subject: Non-Urgent Medical Question  Did you get the results from my chest x-ray? I had it last Thursday, Feb 2nd. I called and left a message for Linus Orn with one of the ladies up front last Friday. Also, I am still itching but not as bad. Sunday, Feb 12th, I began taking 2 pills at night. I've been taking Allegra also as I need it.   Thanks, Jaclyn Alexander

## 2015-08-06 NOTE — Telephone Encounter (Signed)
Chest x ray was negative for abnormal findings.  As she it still itching, I think she may need to see a PCP.  Has seen Dr. Tonia Brooms who does not believe in stress inducted itching which is my working diagnosis.  My evaluation has been completely negative.  She is working in [place] but just moved.  Where would she like to go to see a PCP?

## 2015-08-07 NOTE — Telephone Encounter (Signed)
Call to patient. She is aware of chest x-ray report. States she feels like itching is doing fine now and declines PCP appointment.   Encounter closed.

## 2015-08-28 ENCOUNTER — Other Ambulatory Visit: Payer: Self-pay | Admitting: *Deleted

## 2015-08-28 MED ORDER — CITALOPRAM HYDROBROMIDE 10 MG PO TABS: 20.0000 mg | ORAL_TABLET | Freq: Every day | ORAL | Status: DC

## 2015-08-28 NOTE — Telephone Encounter (Signed)
Medication refill request: Celexa   Last AEX:  07-11-15 Next AEX: 10-28-16 Last MMG (if hormonal medication request): 05-27-15 WNL Refill authorized: please advise

## 2015-09-01 ENCOUNTER — Telehealth: Payer: Self-pay

## 2015-09-01 NOTE — Telephone Encounter (Signed)
[facility] requesting a refill on the patient's citalopram HBR. I called the patient to ask about it since she was just given a refill on 08/28/15. Pt stated that she is taking 1 10mg  tab daily instead of 2-10mg  tabs due to it making her too sleepy to function at work. Pt states she wants to stay on 10mg  tablets once daily. I advised that I would let Dr. Sabra Heck know and refuse the refill request. Pt agreeable to plan and advised to call if she has any trouble filling the Rx.

## 2015-09-01 NOTE — Telephone Encounter (Signed)
Thank you for update.  Ok to [facility] RF request.  Encounter can be closed.

## 2015-09-23 NOTE — Telephone Encounter (Signed)
Unable to close encounter

## 2015-09-25 ENCOUNTER — Other Ambulatory Visit: Payer: Self-pay | Admitting: *Deleted

## 2015-09-25 NOTE — Telephone Encounter (Signed)
Faxed Medication refill request from [facility] : Prolia 60 mg  Last AEX:  07/11/15 with SM   Next AEX: 10/28/16 with SM Last MMG (if hormonal medication request): 05/27/15 bi-rads 1: neg ;  Refill authorized: #1 Syringe

## 2015-09-26 MED ORDER — DENOSUMAB 60 MG/ML ~~LOC~~ SOLN: 60.0000 mg | Freq: Once | SUBCUTANEOUS | Status: DC

## 2015-10-07 ENCOUNTER — Telehealth: Payer: Self-pay | Admitting: *Deleted

## 2015-10-07 NOTE — Telephone Encounter (Signed)
[facility] sent a request for Patients Celexa to be changed to 20mg  qd instead of 10mg  BID. This will save the patient 13.68 a year.  Please advise and I will send in a new order if appropriate. Thanks!

## 2015-10-07 NOTE — Telephone Encounter (Signed)
Pt does not want to take it as 20mg  daily.  She wants to take it 10mg  BID.  Please do not change rx.  Thanks.

## 2016-04-20 ENCOUNTER — Other Ambulatory Visit: Payer: Self-pay | Admitting: Obstetrics & Gynecology

## 2016-04-20 DIAGNOSIS — Z1231 Encounter for screening mammogram for malignant neoplasm of breast: Secondary | ICD-10-CM

## 2016-05-25 ENCOUNTER — Ambulatory Visit
Admission: RE | Admit: 2016-05-25 | Discharge: 2016-05-25 | Disposition: A | Discharge status: Home / Self Care | Payer: Commercial Managed Care - PPO | Source: Ambulatory Visit | Attending: Obstetrics & Gynecology | Admitting: Obstetrics & Gynecology

## 2016-05-25 DIAGNOSIS — Z1231 Encounter for screening mammogram for malignant neoplasm of breast: Secondary | ICD-10-CM

## 2016-08-28 ENCOUNTER — Other Ambulatory Visit: Payer: Self-pay | Admitting: Obstetrics & Gynecology

## 2016-08-30 NOTE — Telephone Encounter (Signed)
Medication refill request: synthroid  Last AEX:  07/11/15 SM  Next AEX: 10/28/16 SM  Last MMG (if hormonal medication request): 05/25/16 BIRADS1:neg Refill authorized: 07/15/15 #90tabs/4R. Today #90/1R?

## 2016-10-26 ENCOUNTER — Ambulatory Visit (INDEPENDENT_AMBULATORY_CARE_PROVIDER_SITE_OTHER): Payer: Commercial Managed Care - PPO | Admitting: Obstetrics & Gynecology

## 2016-10-26 ENCOUNTER — Other Ambulatory Visit (HOSPITAL_COMMUNITY)
Admission: RE | Admit: 2016-10-26 | Discharge: 2016-10-26 | Disposition: A | Discharge status: Home / Self Care | Payer: Commercial Managed Care - PPO | Source: Ambulatory Visit | Attending: Obstetrics & Gynecology | Admitting: Obstetrics & Gynecology

## 2016-10-26 ENCOUNTER — Encounter: Payer: Self-pay | Admitting: Obstetrics & Gynecology

## 2016-10-26 VITALS — BP 120/84 | HR 76 | Resp 16 | Ht 63.0 in | Wt 122.0 lb

## 2016-10-26 DIAGNOSIS — M810 Age-related osteoporosis without current pathological fracture: Secondary | ICD-10-CM | POA: Diagnosis not present

## 2016-10-26 DIAGNOSIS — R3989 Other symptoms and signs involving the genitourinary system: Secondary | ICD-10-CM | POA: Diagnosis not present

## 2016-10-26 DIAGNOSIS — Z01419 Encounter for gynecological examination (general) (routine) without abnormal findings: Secondary | ICD-10-CM | POA: Diagnosis not present

## 2016-10-26 DIAGNOSIS — Z124 Encounter for screening for malignant neoplasm of cervix: Secondary | ICD-10-CM | POA: Diagnosis not present

## 2016-10-26 DIAGNOSIS — Z0000 Encounter for general adult medical examination without abnormal findings: Secondary | ICD-10-CM

## 2016-10-26 LAB — POCT URINALYSIS DIPSTICK
Bilirubin, UA: NEGATIVE
Blood, UA: NEGATIVE
GLUCOSE UA: NEGATIVE
KETONES UA: NEGATIVE
Nitrite, UA: NEGATIVE
Protein, UA: NEGATIVE
pH, UA: 6 (ref 5.0–8.0)

## 2016-10-26 MED ORDER — NONFORMULARY OR COMPOUNDED ITEM: 3 refills | Status: DC

## 2016-10-26 MED ORDER — VALACYCLOVIR HCL 500 MG PO TABS: 500.0000 mg | ORAL_TABLET | Freq: Every day | ORAL | 4 refills | Status: DC

## 2016-10-26 NOTE — Patient Instructions (Signed)
Plan to have a bone density test done at the same time as your mammogram.  Please have Dr. Laqueta Due test a Hepatitis C antibody with your next lab work.

## 2016-10-26 NOTE — Progress Notes (Signed)
56 y.o. G1P1 MarriedCaucasianF here for annual exam.  Doing well.  Yolanda Bonine is going to be 1 in July.  He is doing really well and is meeting all his milestones.    Denies vaginal bleeding.    PCP:  Dr. Laqueta Due  Patient's last menstrual period was 10/20/2010.          Sexually active: Yes.    The current method of family planning is post menopausal status.    Exercising: Yes.    Walking Smoker:  no  Health Maintenance: Pap:  05/09/14 Neg   01/2012 neg. HR HPV:neg  History of abnormal Pap:  Yes, h/o cryosurgery  MMG:  05/25/16 BIRADS1:Neg  Colonoscopy:  11/20/2008 f/u 10 years  BMD:   04/09/15 Osteoporosis.  Stopped Prolia due to itching.  Received Prolia 1/15, 7/15, 1/16, and and 12/16 TDaP: 12/2010  Pneumonia vaccine(s):  No Zostavax:   No Hep C testing: Not done  Screening Labs: PCP, Urine today: WBC=Trace   reports that she has never smoked. She has never used smokeless tobacco. She reports that she does not drink alcohol or use drugs.  Past Medical History:  Diagnosis Date  . Acid reflux   . Anemia   . Basal cell carcinoma 1/13  . HSV-2 infection   . Melanoma ([place]) 5/10   basal cell  . Migraine   . Osteoporosis     Past Surgical History:  Procedure Laterality Date  . dental implants     x2  . GYNECOLOGIC CRYOSURGERY  1980s  . LAPAROSCOPIC CHOLECYSTECTOMY    . LASIK    . MOHS SURGERY    . TONSILLECTOMY      Current Outpatient Prescriptions  Medication Sig Dispense Refill  . calcium citrate-vitamin D (CITRACAL+D) 315-200 MG-UNIT tablet Take by mouth.    . estradiol (ESTRACE) 0.1 MG/GM vaginal cream 1 gram pv twice weekly or prn 42.5 g 4  . fluticasone (FLONASE) 50 MCG/ACT nasal spray Place 2 sprays into the nose as needed for rhinitis.    Marland Kitchen levothyroxine (SYNTHROID, LEVOTHROID) 50 MCG tablet TAKE 1 TABLET DAILY 90 tablet 1  . Magnesium 500 MG CAPS Take by mouth every other day.     . pantoprazole (PROTONIX) 40 MG tablet Take 1 tablet (40 mg total) by mouth  daily. 90 tablet 4  . ranitidine (ZANTAC) 150 MG tablet Take 1 tablet by mouth daily as needed.    . topiramate (TOPAMAX) 50 MG tablet Take 125 mg by mouth daily.     . traZODone (DESYREL) 50 MG tablet Take 1 tablet by mouth at bedtime.  3  . valACYclovir (VALTREX) 500 MG tablet Take 1 tablet (500 mg total) by mouth daily. 90 tablet 4   No current facility-administered medications for this visit.     Family History  Problem Relation Age of Onset  . Lung cancer Mother   . Thyroid disease Mother   . Cancer Other     maternal side-lung cancer and melanoma  . Hypertension Paternal Grandfather   . Heart attack Father   . Stroke Other     maternal side  . Bipolar disorder Brother     ROS:  Pertinent items are noted in HPI.  Otherwise, a comprehensive ROS was negative.  Exam:   BP 120/84 (BP Location: Right Arm, Patient Position: Sitting, Cuff Size: Normal)   Pulse 76   Resp 16   Ht 5\' 3"  (1.6 m)   Wt 122 lb (55.3 kg)   LMP 10/20/2010  BMI 21.61 kg/m   Weight change: -2#   Height: 5\' 3"  (160 cm)  Ht Readings from Last 3 Encounters:  10/26/16 5\' 3"  (1.6 m)  07/11/15 5' 3.25" (1.607 m)  05/07/15 5' 3.75" (1.619 m)    General appearance: alert, cooperative and appears stated age Head: Normocephalic, without obvious abnormality, atraumatic Neck: no adenopathy, supple, symmetrical, trachea midline and thyroid normal to inspection and palpation Lungs: clear to auscultation bilaterally Breasts: normal appearance, no masses or tenderness Heart: regular rate and rhythm Abdomen: soft, non-tender; bowel sounds normal; no masses,  no organomegaly Extremities: extremities normal, atraumatic, no cyanosis or edema Skin: Skin color, texture, turgor normal. No rashes or lesions Lymph nodes: Cervical, supraclavicular, and axillary nodes normal. No abnormal inguinal nodes palpated Neurologic: Grossly normal   Pelvic: External genitalia:  no lesions              Urethra:  normal appearing  urethra with no masses, tenderness or lesions              Bartholins and Skenes: normal                 Vagina: normal appearing vagina with normal color and discharge, no lesions              Cervix: no lesions              Pap taken: Yes.   Bimanual Exam:  Uterus:  normal size, contour, position, consistency, mobility, non-tender              Adnexa: normal adnexa and no mass, fullness, tenderness               Rectovaginal: Confirms               Anus:  normal sphincter tone, no lesions  Chaperone was present for exam.  A:  Well Woman with normal exam PMP no HRT Hypothyroidism H/O fibroids, uterus normal sized on exam Urinary urgency/bladder pressure/vaginal atrophic  H/O HSV Osteoporosis, received 4 doses of Prolia  P:   Mammogram guidelines reviewed Pt will do BMD with MMG in December.  Order placed. pap smear and HR HPV obtained today RF for Valtrex 500mg  daily.  #90/3RF. Rx for estradiol 0.02% cream with Vit E 44ml pv twice weekly.  #24/3RF. Urine culture pending. return annually or prn

## 2016-10-27 LAB — URINE CULTURE

## 2016-10-27 LAB — CYTOLOGY - PAP
DIAGNOSIS: NEGATIVE
HPV (WINDOPATH): NOT DETECTED

## 2016-10-28 ENCOUNTER — Ambulatory Visit: Payer: Commercial Managed Care - PPO | Admitting: Obstetrics & Gynecology

## 2016-10-29 IMAGING — MG MM DIAG BREAST TOMO UNI LEFT
6 series · 6 of 18 positions shown · non-contrast
Comparison: Previous exam(s).

CLINICAL DATA: Recall from screening mammogram

EXAM:
DIGITAL DIAGNOSTIC LEFT MAMMOGRAM WITH 3D TOMOSYNTHESIS AND CAD

[L MLO (1 of 2)]
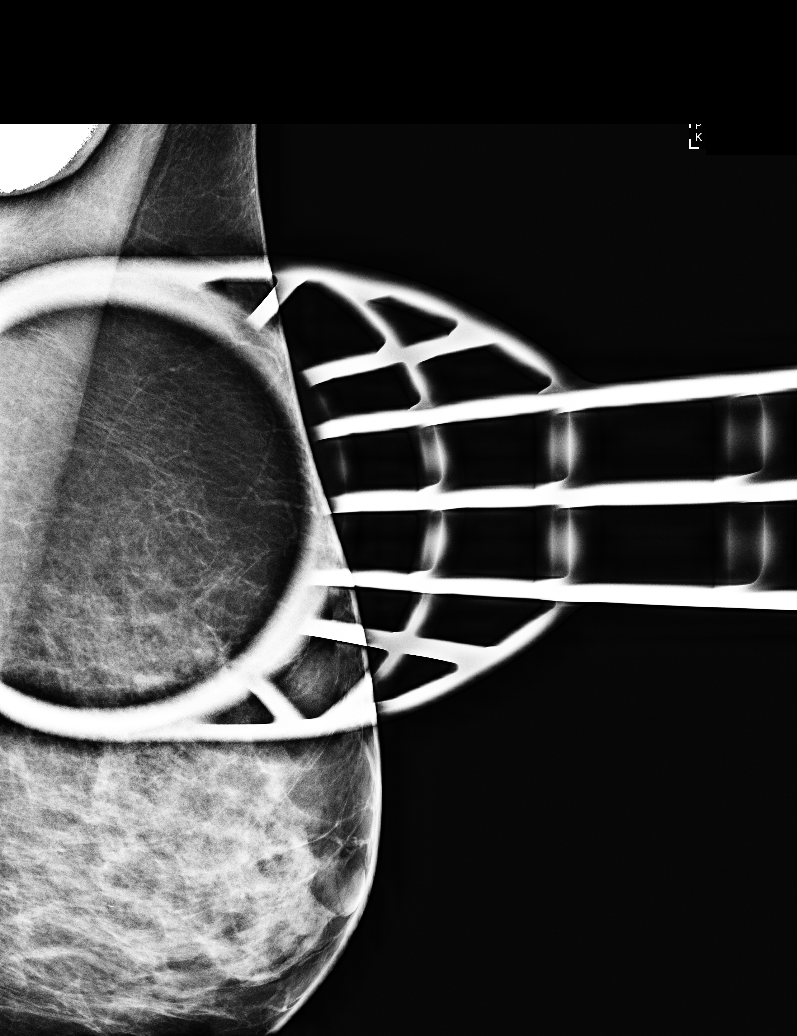

[L ML]
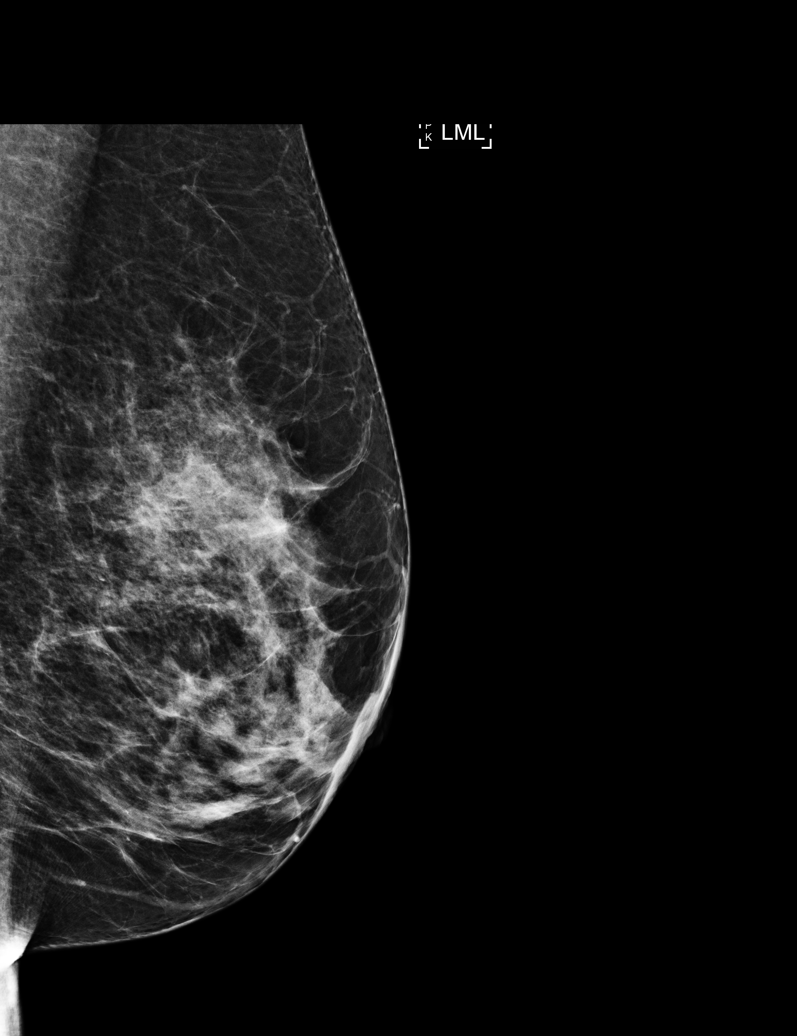

[L MLO (2 of 2)]
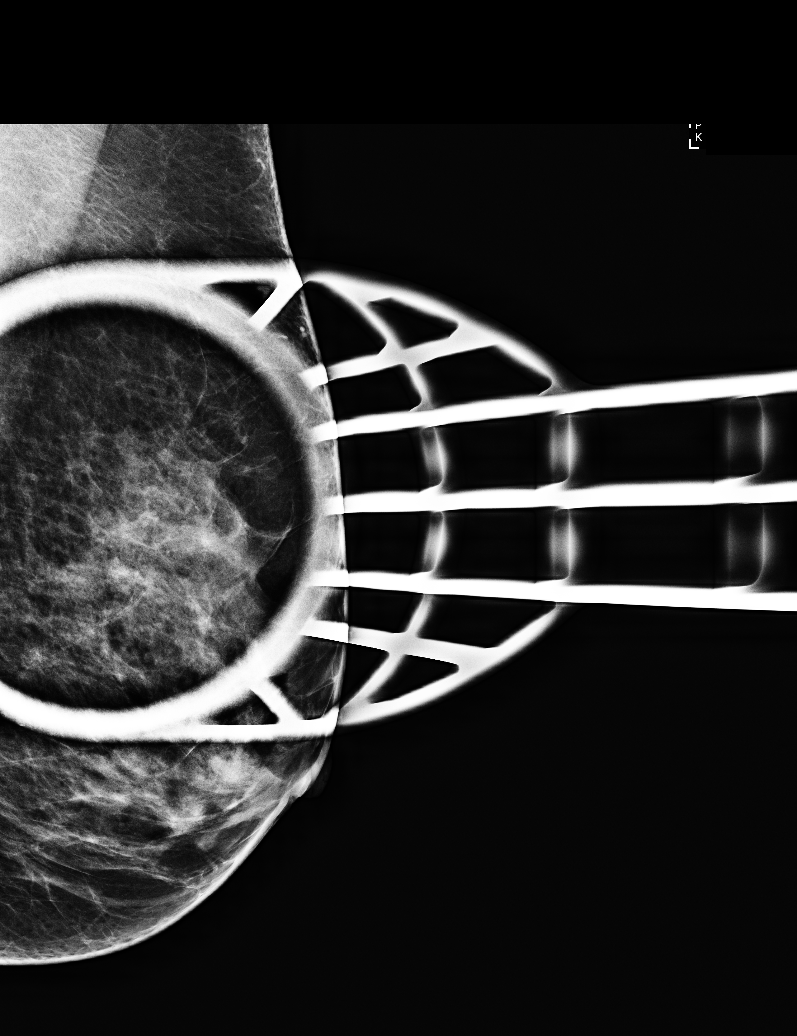

[L MLO tomo (1 of 2) · tomo slice 26/51.0]
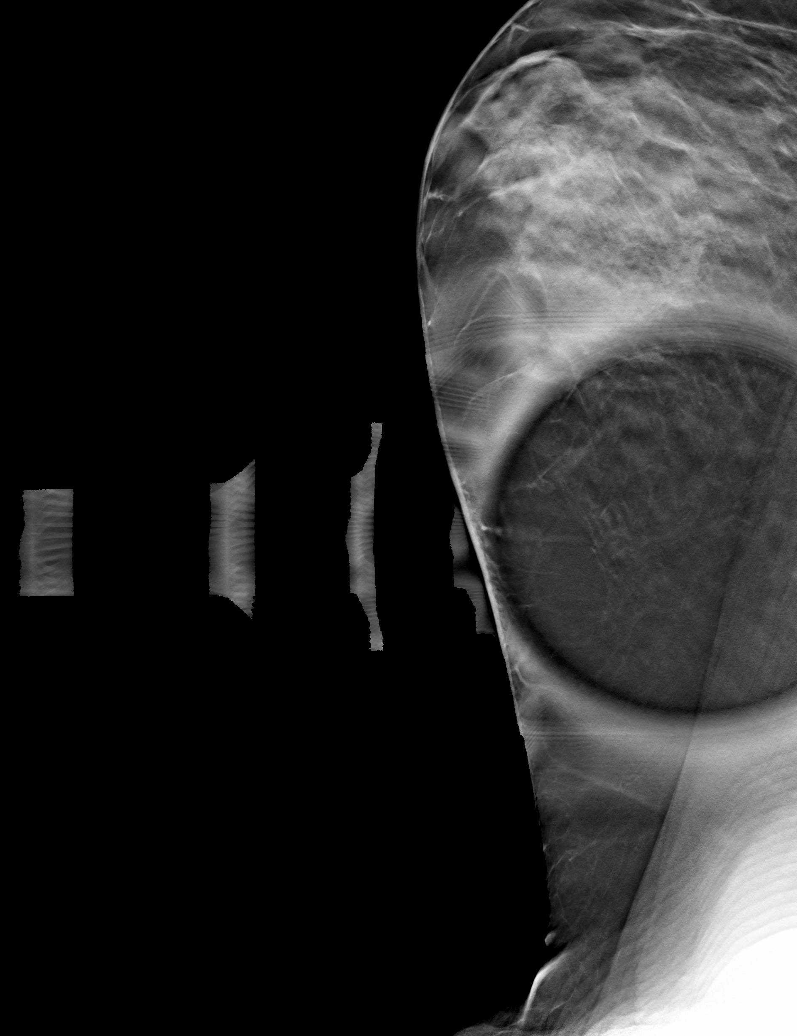

[L MLO tomo (2 of 2) · tomo slice 25/50.0]
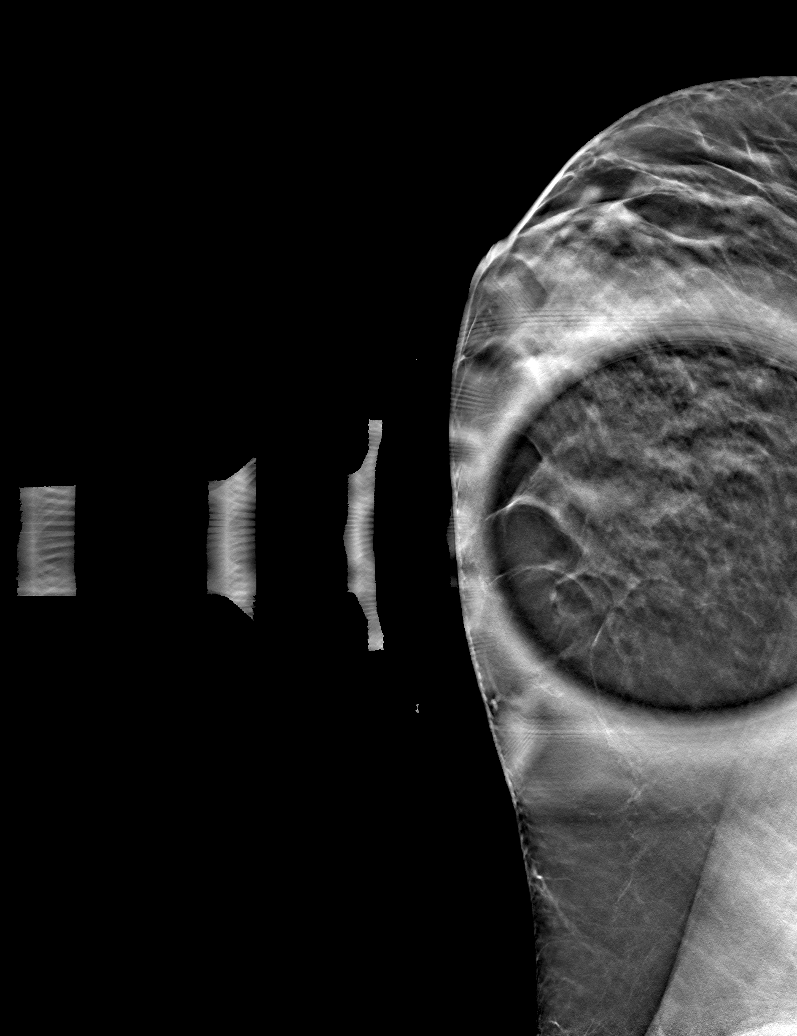

[L ML tomo · tomo slice 27/52.0]
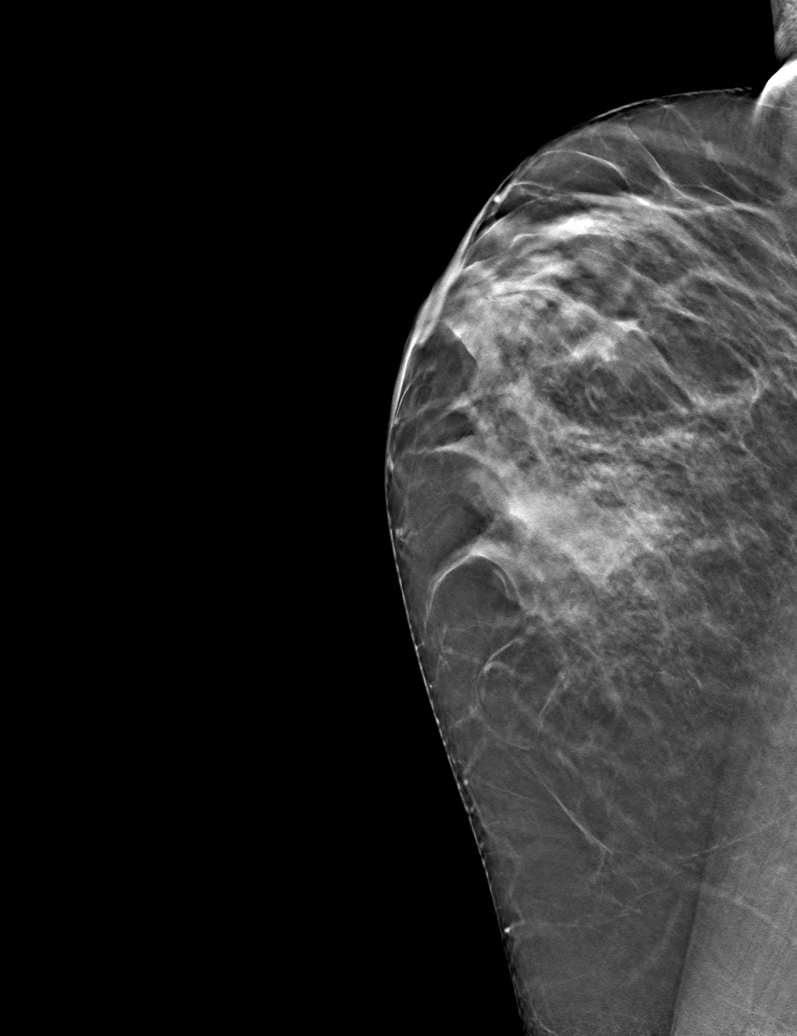

[6 of 18 positions shown; findings below may reference images not displayed]

ACR Breast Density Category b: There are scattered areas of
fibroglandular density.
FINDINGS: Additional views of the left breast demonstrate no persistent
abnormality. The appearance is consistent with a summation shadow.

Mammographic images were processed with CAD.
IMPRESSION: No persistent abnormality on additional evaluation of the left
breast.

RECOMMENDATION:
Screening mammography in 1 year.

I have discussed the findings and recommendations with the patient.
Results were also provided in writing at the conclusion of the
visit. If applicable, a reminder letter will be sent to the patient
regarding the next appointment.

BI-RADS CATEGORY  1: Negative.

## 2017-02-07 ENCOUNTER — Other Ambulatory Visit: Payer: Self-pay | Admitting: Obstetrics & Gynecology

## 2017-02-07 DIAGNOSIS — Z1231 Encounter for screening mammogram for malignant neoplasm of breast: Secondary | ICD-10-CM

## 2017-05-30 ENCOUNTER — Ambulatory Visit: Payer: Commercial Managed Care - PPO

## 2017-07-04 ENCOUNTER — Ambulatory Visit: Payer: Commercial Managed Care - PPO

## 2017-08-03 ENCOUNTER — Ambulatory Visit: Payer: Commercial Managed Care - PPO

## 2017-08-03 ENCOUNTER — Ambulatory Visit
Admission: RE | Admit: 2017-08-03 | Discharge: 2017-08-03 | Disposition: A | Discharge status: Home / Self Care | Payer: Commercial Managed Care - PPO | Source: Ambulatory Visit | Attending: Obstetrics & Gynecology | Admitting: Obstetrics & Gynecology

## 2017-08-03 DIAGNOSIS — Z1231 Encounter for screening mammogram for malignant neoplasm of breast: Secondary | ICD-10-CM

## 2017-08-04 ENCOUNTER — Other Ambulatory Visit: Payer: Self-pay | Admitting: Obstetrics & Gynecology

## 2017-08-04 DIAGNOSIS — R928 Other abnormal and inconclusive findings on diagnostic imaging of breast: Secondary | ICD-10-CM

## 2017-08-08 ENCOUNTER — Ambulatory Visit
Admission: RE | Admit: 2017-08-08 | Discharge: 2017-08-08 | Disposition: A | Discharge status: Home / Self Care | Payer: Commercial Managed Care - PPO | Source: Ambulatory Visit | Attending: Obstetrics & Gynecology | Admitting: Obstetrics & Gynecology

## 2017-08-08 DIAGNOSIS — R928 Other abnormal and inconclusive findings on diagnostic imaging of breast: Secondary | ICD-10-CM

## 2018-01-19 NOTE — Progress Notes (Signed)
57 y.o. G1P1 MarriedCaucasianF here for annual exam.  Doing well.  Grandson just turned 3 and he is doing well.    Denies vaginal bleeding.    Having stress urinary incontinence issues.  Does not want to do anything at this point.  PT discussed.  Not interested in surgery.  PCP:  Dr. Laqueta Due.  Blood work was done last week.  Patient's last menstrual period was 10/20/2010.          Sexually active: Yes.    The current method of family planning is post menopausal status.    Exercising: Yes.    walking Smoker:  no  Health Maintenance: Pap:  10/26/16 Neg. HR HPV:neg   05/09/14 Neg  History of abnormal Pap:  Yes, h/o cryosurgery  MMG:  08/08/17 Diagnostic Left BIRADS2:Benign. F/u 1 year  Colonoscopy:  11/20/08 Normal. F/u 10 years  BMD:   04/09/15  TDaP:  2012 Pneumonia vaccine(s):  n/a Shingrix:   D/w pt shingrix.  Prescription given today. Hep C testing: PCP Screening Labs: PCP   reports that she has never smoked. She has never used smokeless tobacco. She reports that she does not drink alcohol or use drugs.  Past Medical History:  Diagnosis Date  . Acid reflux   . Anemia   . Basal cell carcinoma 1/13  . HSV-2 infection   . Melanoma ([place]) 5/10   basal cell  . Migraine   . Osteoporosis     Past Surgical History:  Procedure Laterality Date  . dental implants     x2  . GYNECOLOGIC CRYOSURGERY  1980s  . LAPAROSCOPIC CHOLECYSTECTOMY    . LASIK    . MOHS SURGERY    . TONSILLECTOMY      Current Outpatient Medications  Medication Sig Dispense Refill  . calcium citrate-vitamin D (CITRACAL+D) 315-200 MG-UNIT tablet Take by mouth.    . fexofenadine (ALLEGRA) 180 MG tablet Take by mouth daily as needed.    . fluticasone (FLONASE) 50 MCG/ACT nasal spray Place 2 sprays into the nose as needed for rhinitis.    Marland Kitchen levothyroxine (SYNTHROID, LEVOTHROID) 50 MCG tablet TAKE 1 TABLET DAILY 90 tablet 1  . NONFORMULARY OR COMPOUNDED ITEM Estradiol 0.02% cream with Vit E added.  9ml pv two  times weekly.  24 preloaded syringes. 24 each 3  . pantoprazole (PROTONIX) 40 MG tablet Take 1 tablet (40 mg total) by mouth daily. 90 tablet 4  . ranitidine (ZANTAC) 150 MG tablet Take 1 tablet by mouth daily as needed.    . topiramate (TOPAMAX) 50 MG tablet Take 125 mg by mouth daily.     . traZODone (DESYREL) 50 MG tablet Take 1 tablet by mouth at bedtime.  3  . valACYclovir (VALTREX) 500 MG tablet Take 1 tablet (500 mg total) by mouth daily. 90 tablet 4   No current facility-administered medications for this visit.     Family History  Problem Relation Age of Onset  . Lung cancer Mother   . Thyroid disease Mother   . Cancer Other        maternal side-lung cancer and melanoma  . Hypertension Paternal Grandfather   . Heart attack Father   . Stroke Other        maternal side  . Bipolar disorder Brother     Review of Systems  All other systems reviewed and are negative.   Exam:   BP 120/88 (BP Location: Right Arm, Patient Position: Sitting, Cuff Size: Normal)   Pulse 68  Resp 16   Ht 5' 3.5" (1.613 m)   Wt 115 lb 9.6 oz (52.4 kg)   LMP 10/20/2010   BMI 20.16 kg/m   Height: 5' 3.5" (161.3 cm)  Ht Readings from Last 3 Encounters:  01/20/18 5' 3.5" (1.613 m)  10/26/16 5\' 3"  (1.6 m)  07/11/15 5' 3.25" (1.607 m)    General appearance: alert, cooperative and appears stated age Head: Normocephalic, without obvious abnormality, atraumatic Neck: no adenopathy, supple, symmetrical, trachea midline and thyroid normal to inspection and palpation Lungs: clear to auscultation bilaterally Breasts: normal appearance, no masses or tenderness Heart: regular rate and rhythm Abdomen: soft, non-tender; bowel sounds normal; no masses,  no organomegaly Extremities: extremities normal, atraumatic, no cyanosis or edema Skin: Skin color, texture, turgor normal. No rashes or lesions Lymph nodes: Cervical, supraclavicular, and axillary nodes normal. No abnormal inguinal nodes  palpated Neurologic: Grossly normal   Pelvic: External genitalia:  no lesions              Urethra:  normal appearing urethra with no masses, tenderness or lesions              Bartholins and Skenes: normal                 Vagina: normal appearing vagina with normal color and discharge, no lesions              Cervix: no lesions              Pap taken: No. Bimanual Exam:  Uterus:  enlarged, 8-10 weeks size              Adnexa: no mass, fullness, tenderness               Rectovaginal: Confirms               Anus:  normal sphincter tone, no lesions  Chaperone was present for exam.  A:  Well Woman with normal exam PMP, no HRT Hypothyroidism Mild SUI, not interested in treatment yet Vaginal atrophic changes Uterine fibroids, stable on exam Osteoporosis, received 4 doses of Prolia.  Stopped due to side effects H/O HSV  P:   Mammogram guidelines reviewed.   pap smear with neg HR HPV 2018.  Not indicated today.  BMD scheduled for pt Blood work done with PCP [facility] estradiol 2% [facility] Order for shingrix vaccination given.  Colonoscopy due next year return annually or prn

## 2018-01-20 ENCOUNTER — Ambulatory Visit: Payer: Commercial Managed Care - PPO | Admitting: Obstetrics & Gynecology

## 2018-01-20 ENCOUNTER — Encounter: Payer: Self-pay | Admitting: Obstetrics & Gynecology

## 2018-01-20 ENCOUNTER — Encounter

## 2018-01-20 ENCOUNTER — Other Ambulatory Visit: Payer: Self-pay

## 2018-01-20 VITALS — BP 120/88 | HR 68 | Resp 16 | Ht 63.5 in | Wt 115.6 lb

## 2018-01-20 DIAGNOSIS — Z01419 Encounter for gynecological examination (general) (routine) without abnormal findings: Secondary | ICD-10-CM | POA: Diagnosis not present

## 2018-01-20 MED ORDER — NONFORMULARY OR COMPOUNDED ITEM: 3 refills | Status: DC

## 2018-01-20 MED ORDER — VALACYCLOVIR HCL 500 MG PO TABS: 500.0000 mg | ORAL_TABLET | Freq: Every day | ORAL | 4 refills | Status: DC

## 2018-01-20 NOTE — Patient Instructions (Signed)
[facility]  [address]  [place], [place] [address] Phone: [phone]

## 2018-01-20 NOTE — Progress Notes (Signed)
Patient scheduled while in office at [facility] for BMD on 02/08/18 arriving at 7:30am for 8am appt. Patient aware to stop all calcium supplements, vitamins and antacids 48hrs prior to appt. Patient verbalizes understanding. Written order faxed to [facility] at [phone].

## 2018-03-01 ENCOUNTER — Encounter: Payer: Self-pay | Admitting: Obstetrics & Gynecology

## 2018-03-01 ENCOUNTER — Telehealth: Payer: Self-pay | Admitting: Obstetrics & Gynecology

## 2018-03-01 NOTE — Telephone Encounter (Signed)
Spoke with Sharyn Lull at [facility], requested copy of BMD dated 02/08/18, fax to [phone].

## 2018-03-01 NOTE — Telephone Encounter (Signed)
BMD report received and placed on Dr. Sanjuan Dame desk for review.

## 2018-03-01 NOTE — Telephone Encounter (Signed)
Patient sent the following correspondence through [place]. Routing to triage to assist patient with request.  Hi Dr. Sabra Heck,  My life has been crazy busy & I just realized I haven't heard anything from the bone density scan I had done on 02/08/18  Did you get a report from [place] you are well!   Thank you,  Jaclyn Alexander

## 2018-03-09 ENCOUNTER — Encounter: Payer: Self-pay | Admitting: Obstetrics & Gynecology

## 2018-03-09 ENCOUNTER — Telehealth: Payer: Self-pay | Admitting: Obstetrics & Gynecology

## 2018-03-09 NOTE — Telephone Encounter (Signed)
Patient sent the following message through [place]. Routing to triage to assist patient with request. Fax with results received, in Dr. Ammie Ferrier folder for review.   Bone density scan - I just talked to someone at [facility] & they sent my results to the wrong Dr Sabra Heck. She was faxing the results to your office this afternoon.

## 2018-03-09 NOTE — Telephone Encounter (Signed)
Patient sent the following correspondence through [place]. Routing to triage to assist patient with request.  Results from bone density scan  Did you receive the results from my scan on 02/08/18? I have not.  Thanks.

## 2018-03-10 NOTE — Telephone Encounter (Signed)
Spoke with patient. Advised BMD results received. Dr. Sabra Heck will review, our office will return call once reviewed. Patient is agreeable and thankful for update.

## 2018-03-15 ENCOUNTER — Encounter: Payer: Self-pay | Admitting: Obstetrics & Gynecology

## 2018-03-15 NOTE — Telephone Encounter (Signed)
Encounter closed

## 2018-03-15 NOTE — Telephone Encounter (Signed)
[place] message sent to patient.  Ok to close encounter.

## 2018-03-17 ENCOUNTER — Telehealth: Payer: Self-pay | Admitting: Obstetrics & Gynecology

## 2018-03-17 NOTE — Telephone Encounter (Signed)
Patient sent the following response through [place].    Thank you. I'm going to read about the drugs you mentioned & their side effects and I will get back with you.    ----- Message -----  From: Megan Salon, MD  Sent: 03/15/18, 4:42 PM  To: [name]  Subject: bone density    [name],  Your bone density has decreased since the one two years ago. Your spine measurements are still in the osteopenia range (and not osteoporosis). Your hips have worsened again and are about the same as in 2013. I know you stopped the Prolia due to itching. I think you should start something for treatment. I would suggest considering Reclast (which is a once a year infusion). You could consider Fosamax as well. How can I help with decision? Do you want to talk in person? I'm sure you have questions. Please let me know. Thanks.    Jaclyn Alexander

## 2018-04-05 ENCOUNTER — Encounter: Payer: Self-pay | Admitting: Obstetrics & Gynecology

## 2018-07-12 ENCOUNTER — Other Ambulatory Visit: Payer: Self-pay | Admitting: Obstetrics & Gynecology

## 2018-07-12 DIAGNOSIS — Z1231 Encounter for screening mammogram for malignant neoplasm of breast: Secondary | ICD-10-CM

## 2018-08-21 ENCOUNTER — Ambulatory Visit
Admission: RE | Admit: 2018-08-21 | Discharge: 2018-08-21 | Disposition: A | Discharge status: Home / Self Care | Payer: Commercial Managed Care - PPO | Source: Ambulatory Visit | Attending: Obstetrics & Gynecology | Admitting: Obstetrics & Gynecology

## 2018-08-21 DIAGNOSIS — Z1231 Encounter for screening mammogram for malignant neoplasm of breast: Secondary | ICD-10-CM

## 2018-12-08 ENCOUNTER — Encounter: Payer: Self-pay | Admitting: Gastroenterology

## 2019-01-23 ENCOUNTER — Other Ambulatory Visit: Payer: Self-pay

## 2019-01-25 ENCOUNTER — Ambulatory Visit (INDEPENDENT_AMBULATORY_CARE_PROVIDER_SITE_OTHER): Payer: Commercial Managed Care - PPO | Admitting: Obstetrics & Gynecology

## 2019-01-25 ENCOUNTER — Other Ambulatory Visit: Payer: Self-pay

## 2019-01-25 ENCOUNTER — Encounter: Payer: Self-pay | Admitting: Obstetrics & Gynecology

## 2019-01-25 VITALS — BP 138/86 | HR 68 | Temp 97.0°F | Ht 63.0 in | Wt 124.0 lb

## 2019-01-25 DIAGNOSIS — E039 Hypothyroidism, unspecified: Secondary | ICD-10-CM | POA: Diagnosis not present

## 2019-01-25 DIAGNOSIS — M810 Age-related osteoporosis without current pathological fracture: Secondary | ICD-10-CM | POA: Diagnosis not present

## 2019-01-25 DIAGNOSIS — Z01419 Encounter for gynecological examination (general) (routine) without abnormal findings: Secondary | ICD-10-CM

## 2019-01-25 MED ORDER — NONFORMULARY OR COMPOUNDED ITEM: 3 refills | Status: DC

## 2019-01-25 MED ORDER — VALACYCLOVIR HCL 500 MG PO TABS: 500.0000 mg | ORAL_TABLET | Freq: Every day | ORAL | 4 refills | Status: DC

## 2019-01-25 NOTE — Progress Notes (Signed)
58 y.o. G1P1 Married White or Caucasian female here for annual exam.  Denies vaginal bleeding.    Pt had BMD last year.  Osteoporosis was present again.  She did not decide on any therapy.  Prolia caused significant itching.  She has severe GERD and is anxious about any oral therapy.  Takes protonix and Zantac.  Hasn't had an upper GI in 5 years.    PCP:  Dr. Laqueta Due.  Had appt in march.  Had blood work then.    Patient's last menstrual period was 10/20/2010.          Sexually active: Yes.    The current method of family planning is post menopausal status.    Exercising: Yes.    walking Smoker:  no  Health Maintenance: Pap:  10/26/16 Neg. HR HPV:neg   05/09/14 neg  History of abnormal Pap:  Yes, cryo MMG:  08/21/18 BIRADS1:neg Colonoscopy:  11/20/08 f/u 10 years.  Pt interested in cologuard this year.   BMD:   02/08/18 osteoporosis  TDaP:  2012 Pneumonia vaccine(s):  n/a Shingrix:   No Hep C testing: PCP Screening Labs: Needs thyroid testing done today   reports that she has never smoked. She has never used smokeless tobacco. She reports that she does not drink alcohol or use drugs.  Past Medical History:  Diagnosis Date  . Acid reflux   . Anemia   . Basal cell carcinoma 1/13  . HSV-2 infection   . Melanoma ([place]) 5/10   basal cell  . Migraine   . Osteoporosis     Past Surgical History:  Procedure Laterality Date  . dental implants     x2  . GYNECOLOGIC CRYOSURGERY  1980s  . LAPAROSCOPIC CHOLECYSTECTOMY    . LASIK    . MOHS SURGERY    . TONSILLECTOMY      Current Outpatient Medications  Medication Sig Dispense Refill  . fexofenadine (ALLEGRA) 180 MG tablet Take by mouth daily as needed.    . fluticasone (FLONASE) 50 MCG/ACT nasal spray Place 2 sprays into the nose as needed for rhinitis.    . NONFORMULARY OR COMPOUNDED ITEM Estradiol 0.02% cream with Vit E added.  31ml pv two times weekly.  24 preloaded syringes. 24 each 3  . pantoprazole (PROTONIX) 40 MG tablet Take 1  tablet (40 mg total) by mouth daily. 90 tablet 4  . SYNTHROID 75 MCG tablet Take 1 tablet by mouth daily.    Marland Kitchen topiramate (TOPAMAX) 50 MG tablet Take 125 mg by mouth daily.     . traZODone (DESYREL) 50 MG tablet Take 1 tablet by mouth at bedtime.  3  . valACYclovir (VALTREX) 500 MG tablet Take 1 tablet (500 mg total) by mouth daily. 90 tablet 4   No current facility-administered medications for this visit.     Family History  Problem Relation Age of Onset  . Lung cancer Mother   . Thyroid disease Mother   . Cancer Other        maternal side-lung cancer and melanoma  . Hypertension Paternal Grandfather   . Heart attack Father   . Stroke Other        maternal side  . Bipolar disorder Brother     Review of Systems  All other systems reviewed and are negative.   Exam:   BP 138/86   Pulse 68   Temp (!) 97 F (36.1 C) (Temporal)   Ht 5\' 3"  (1.6 m)   Wt 124 lb (56.2 kg)  LMP 10/20/2010   BMI 21.97 kg/m     Height: 5\' 3"  (160 cm)  Ht Readings from Last 3 Encounters:  01/25/19 5\' 3"  (1.6 m)  01/20/18 5' 3.5" (1.613 m)  10/26/16 5\' 3"  (1.6 m)    General appearance: alert, cooperative and appears stated age Head: Normocephalic, without obvious abnormality, atraumatic Neck: no adenopathy, supple, symmetrical, trachea midline and thyroid normal to inspection and palpation Lungs: clear to auscultation bilaterally Breasts: normal appearance, no masses or tenderness Heart: regular rate and rhythm Abdomen: soft, non-tender; bowel sounds normal; no masses,  no organomegaly Extremities: extremities normal, atraumatic, no cyanosis or edema Skin: Skin color, texture, turgor normal. No rashes or lesions Lymph nodes: Cervical, supraclavicular, and axillary nodes normal. No abnormal inguinal nodes palpated Neurologic: Grossly normal   Pelvic: External genitalia:  no lesions              Urethra:  normal appearing urethra with no masses, tenderness or lesions              Bartholins  and Skenes: normal                 Vagina: normal appearing vagina with normal color and discharge, no lesions              Cervix: no lesions              Pap taken: No. Bimanual Exam:  Uterus:  enlarged, 8-10 weeks weeks size              Adnexa: no mass, fullness, tenderness               Rectovaginal: Confirms               Anus:  normal sphincter tone, no lesions  Chaperone was present for exam.  A:  Well Woman with normal exam PMP, no HRT Hypothyroidism Mild SUI Vaginal atrophic changes H/o uterine fibroids Osteoporosis, received 4 doses of Prolia.  Had itching with this.   GERD H/O HSV  P:   Mammogram guidelines reviewed.  Doing yearly. pap smear and HR HPV 2018.  Guidelines reviewed.  Not indicated today She is going to return for [place] for her colonoscopy/upper endoscopy Endocrinology referral will be made for consideration of Reclast.  She would like expert opinion about options. TSH and free T4 RF for Valtrex 500mg  daily.  #90/4RF RF for estradiol 2% cream with Vit E to [place] return annually or prn

## 2019-01-25 NOTE — Patient Instructions (Signed)
[facility] [address] [place], [place] [address] [phone]

## 2019-01-26 LAB — T4, FREE: Free T4: 1.33 ng/dL (ref 0.82–1.77)

## 2019-01-26 LAB — TSH: TSH: 0.556 u[IU]/mL (ref 0.450–4.500)

## 2019-09-06 ENCOUNTER — Other Ambulatory Visit: Payer: Self-pay | Admitting: Obstetrics & Gynecology

## 2019-09-06 DIAGNOSIS — Z1231 Encounter for screening mammogram for malignant neoplasm of breast: Secondary | ICD-10-CM

## 2019-09-27 ENCOUNTER — Other Ambulatory Visit: Payer: Self-pay

## 2019-09-27 ENCOUNTER — Ambulatory Visit
Admission: RE | Admit: 2019-09-27 | Discharge: 2019-09-27 | Disposition: A | Discharge status: Home / Self Care | Payer: Commercial Managed Care - PPO | Source: Ambulatory Visit | Attending: Obstetrics & Gynecology | Admitting: Obstetrics & Gynecology

## 2019-09-27 DIAGNOSIS — Z1231 Encounter for screening mammogram for malignant neoplasm of breast: Secondary | ICD-10-CM

## 2019-10-11 ENCOUNTER — Encounter: Payer: Self-pay | Admitting: Neurology

## 2019-10-12 ENCOUNTER — Encounter: Payer: Self-pay | Admitting: Gastroenterology

## 2019-11-15 ENCOUNTER — Ambulatory Visit: Payer: Commercial Managed Care - PPO | Admitting: Gastroenterology

## 2019-11-29 ENCOUNTER — Encounter: Payer: Self-pay | Admitting: Gastroenterology

## 2019-11-29 ENCOUNTER — Ambulatory Visit (INDEPENDENT_AMBULATORY_CARE_PROVIDER_SITE_OTHER): Payer: Commercial Managed Care - PPO | Admitting: Gastroenterology

## 2019-11-29 VITALS — BP 110/80 | HR 85 | Ht 63.0 in | Wt 129.0 lb

## 2019-11-29 DIAGNOSIS — K449 Diaphragmatic hernia without obstruction or gangrene: Secondary | ICD-10-CM

## 2019-11-29 DIAGNOSIS — K5904 Chronic idiopathic constipation: Secondary | ICD-10-CM | POA: Diagnosis not present

## 2019-11-29 DIAGNOSIS — K219 Gastro-esophageal reflux disease without esophagitis: Secondary | ICD-10-CM | POA: Diagnosis not present

## 2019-11-29 DIAGNOSIS — R1310 Dysphagia, unspecified: Secondary | ICD-10-CM

## 2019-11-29 NOTE — Patient Instructions (Signed)
If you are age 61 or older, your body mass index should be between 23-30. Your Body mass index is 22.85 kg/m. If this is out of the aforementioned range listed, please consider follow up with your Primary Care Provider.  If you are age 79 or younger, your body mass index should be between 19-25. Your Body mass index is 22.85 kg/m. If this is out of the aformentioned range listed, please consider follow up with your Primary Care Provider.   You have been scheduled for an endoscopy. Please follow written instructions given to you at your visit today. If you use inhalers (even only as needed), please bring them with you on the day of your procedure.  Due to recent changes in healthcare laws, you may see the results of your imaging and laboratory studies on MyChart before your provider has had a chance to review them.  We understand that in some cases there may be results that are confusing or concerning to you. Not all laboratory results come back in the same time frame and the provider may be waiting for multiple results in order to interpret others.  Please give [place] 48 hours in order for your provider to thoroughly review all the results before contacting the office for clarification of your results.   We will obtain records from Dr [facility] office for review.  I appreciate the opportunity to care for you. Thank you for choosing me and [facility],  Dr. Harl Bowie

## 2019-11-29 NOTE — Progress Notes (Signed)
Jaclyn Alexander    [id number]    Nov 18, 1960  Primary Care Physician:Prochnau, Chrys Racer, MD  Referring Physician: Ernestene Kiel, MD [address]. [place],  [place] [address]   Chief complaint: GERD, dysphagia  HPI:  59 year old very pleasant female here for new patient visit with complaints of worsening GERD symptoms and intermittent solid dysphagia.  She was previously followed by Dr. Sharlett Iles  She has been taking Protonix for a long time, she feels it is no longer helping her symptoms hence she discontinued it.  Currently taking Pepcid twice daily with persistent breakthrough heartburn and regurgitation.  She also has intermittent dysphagia mostly with bread and meats.  History of chronic constipation with alternating diarrhea.  She uses MiraLAX as needed with persistent symptoms.  Denies any rectal bleeding or blood in stool.  Colonoscopy by Dr. Sharlett Iles 11/20/2008: Melanosis coli otherwise normal exam.  Recommended recall colonoscopy in 10 years  EGD by Dr. Sharlett Iles Nov 06, 2008: For dysphagia 3 to 4 cm hiatal hernia.  Distal esophageal stricture dilated with Jacinto Halim French, distal esophagus negative for eosinophilic esophagitis or so metaplasia and gastric antral biopsies letter for H. pylori, had mild antral gastritis.  Colonoscopy with Dr Collene Mares 2015 per patient, report is not available, she does not recall if she had any findings during the exam.  Outpatient Encounter Medications as of 11/29/2019  Medication Sig  . fexofenadine (ALLEGRA) 180 MG tablet Take by mouth daily as needed.  . fluticasone (FLONASE) 50 MCG/ACT nasal spray Place 2 sprays into the nose as needed for rhinitis.  . NONFORMULARY OR COMPOUNDED ITEM Estradiol 0.02% cream with Vit E added.  28ml pv two times weekly.  24 preloaded syringes.  . pantoprazole (PROTONIX) 40 MG tablet Take 1 tablet (40 mg total) by mouth daily.  Marland Kitchen SYNTHROID 75 MCG tablet Take 1 tablet by mouth daily.  Marland Kitchen topiramate  (TOPAMAX) 50 MG tablet Take 125 mg by mouth daily.   . traZODone (DESYREL) 50 MG tablet Take 1 tablet by mouth at bedtime.  . valACYclovir (VALTREX) 500 MG tablet Take 1 tablet (500 mg total) by mouth daily.   No facility-administered encounter medications on file as of 11/29/2019.    Allergies as of 11/29/2019 - Review Complete 01/25/2019  Allergen Reaction Noted  . Penicillins    . Sulfonamide derivatives    . Prolia [denosumab] Itching 10/26/2016    Past Medical History:  Diagnosis Date  . Acid reflux   . Anemia   . Basal cell carcinoma 1/13  . HSV-2 infection   . Melanoma ([place]) 5/10   basal cell  . Migraine   . Osteoporosis     Past Surgical History:  Procedure Laterality Date  . dental implants     x2  . GYNECOLOGIC CRYOSURGERY  1980s  . LAPAROSCOPIC CHOLECYSTECTOMY    . LASIK    . MOHS SURGERY    . TONSILLECTOMY      Family History  Problem Relation Age of Onset  . Lung cancer Mother   . Thyroid disease Mother   . Cancer Other        maternal side-lung cancer and melanoma  . Hypertension Paternal Grandfather   . Heart attack Father   . Stroke Other        maternal side  . Bipolar disorder Brother     Social History   Socioeconomic History  . Marital status: Married    Spouse name: Not on  file  . Number of children: Not on file  . Years of education: Not on file  . Highest education level: Not on file  Occupational History  . Not on file  Tobacco Use  . Smoking status: Never Smoker  . Smokeless tobacco: Never Used  Vaping Use  . Vaping Use: Never used  Substance and Sexual Activity  . Alcohol use: No  . Drug use: No  . Sexual activity: Yes    Partners: Male    Birth control/protection: Post-menopausal  Other Topics Concern  . Not on file  Social History Narrative  . Not on file   Social Determinants of Health   Financial Resource Strain:   . Difficulty of Paying Living Expenses:   Food Insecurity:   . Worried About Sales executive in the Last Year:   . Arboriculturist in the Last Year:   Transportation Needs:   . Film/video editor (Medical):   Marland Kitchen Lack of Transportation (Non-Medical):   Physical Activity:   . Days of Exercise per Week:   . Minutes of Exercise per Session:   Stress:   . Feeling of Stress :   Social Connections:   . Frequency of Communication with Friends and Family:   . Frequency of Social Gatherings with Friends and Family:   . Attends Religious Services:   . Active Member of Clubs or Organizations:   . Attends Archivist Meetings:   Marland Kitchen Marital Status:   Intimate Partner Violence:   . Fear of Current or Ex-Partner:   . Emotionally Abused:   Marland Kitchen Physically Abused:   . Sexually Abused:       Review of systems: All other review of systems negative except as mentioned in the HPI.   Physical Exam: Vitals:   11/29/19 1056  BP: 110/80  Pulse: 85   Body mass index is 22.85 kg/m. Gen:      No acute distress Neuro: alert and oriented x 3 Psych: normal mood and affect  Data Reviewed:  Reviewed labs, radiology imaging, old records and pertinent past GI work up   Assessment and Plan/Recommendations:  59 year old female with history of chronic GERD, hiatal hernia, history of esophageal stricture here with complaints of recurrent GERD symptoms and solid dysphagia  We will proceed with EGD for further evaluation, esophageal dilation plus esophageal biopsies if needed The risks and benefits as well as alternatives of endoscopic procedure(s) have been discussed and reviewed. All questions answered. The patient agrees to proceed.  Continue Pepcid twice daily Antireflux measures  History of gastritis NSAID related, H. pylori negative Tries to minimize NSAID use, has chronic headaches and migraine, has follow-up appointment with neurology.  Chronic idiopathic constipation: Start MiraLAX half capful daily and titrate as needed to have 1-2 soft bowel movements daily Advised  patient to use MiraLAX consistently to prevent alternating constipation and diarrhea Increase dietary fiber and fluid intake  We will obtain records from Dr. Lorie Apley office to review last colonoscopy report, will need to determine recall colonoscopy based on that   The patient was provided an opportunity to ask questions and all were answered. The patient agreed with the plan and demonstrated an understanding of the instructions.  Damaris Hippo , MD    CC: Ernestene Kiel, MD

## 2019-12-21 NOTE — Progress Notes (Signed)
NEUROLOGY CONSULTATION NOTE  PATRECIA Alexander MRN: [id number] DOB: 08-06-1960  Referring provider: Ernestene Kiel, MD Primary care provider: Ernestene Kiel, MD  Reason for consult:  headache  HISTORY OF PRESENT ILLNESS: Jaclyn Alexander is a 59 year old right-handed female who presents for headaches.  History supplemented by referring provider's note.  She started having headaches since a teenager.   They are moderate pressure pain under the eyes/maxilla or in back of head.  No associated nausea, vomiting, photophobia, phonophobia, visual disturbance, numbness or weakness.  Often lasts a day.  They occur 10 to 15 days a month.  Smells such as gasoline or perfumes triggers them.  Applying pressure helps.    Takes Tylenol or ibuprofen.  She takes a pain reliever most days out of the week.  She has reflux so she would rather avoid NSAIDs.  Scheduled for endoscopy on 7/16. Current NSAIDS:  ibuprofen Current analgesics:  Tylenol Current triptans:  none Current ergotamine:  none Current anti-emetic:  none Current muscle relaxants:  none Current anti-anxiolytic:  none Current sleep aide:  none Current Antihypertensive medications:  none Current Antidepressant medications:   Current Anticonvulsant medications:  topiramate 75mg  (previously on 150mg , on topiramate for 11 years) Current anti-CGRP:  none Current Vitamins/Herbal/Supplements:  none Current Antihistamines/Decongestants:  Allegra Other therapy:  none Hormone/birth control:  estradiol  Past NSAIDS:  none Past analgesics:  [name], BC Past abortive triptans:  none Past abortive ergotamine:  none Past muscle relaxants:  Skelaxin (helped relieve tension for the headache) Past anti-emetic:  none Past antihypertensive medications:  none Past antidepressant medications:  none Past anticonvulsant medications:  none Past anti-CGRP:  none Past vitamins/Herbal/Supplements:  none Past antihistamines/decongestants:  none Other  past therapies:  none  Caffeine:  Seldom drinks coffee.   Diet:  4 to 5 glasses water daily.  No soda.  Sometimes skips meals. Exercise:  Walks twice a day Depression:  Maybe a little; Anxiety:  yes Other pain:  no Sleep:  Trouble staying asleep.  Wakes up 2 or 3 AM.  Sometimes trouble falling back to sleep. Family history of headache:  Mother, sisters   PAST MEDICAL HISTORY: Past Medical History:  Diagnosis Date  . Acid reflux   . Anemia   . Basal cell carcinoma 1/13  . HSV-2 infection   . Melanoma ([place]) 5/10   basal cell  . Migraine   . Osteoporosis     PAST SURGICAL HISTORY: Past Surgical History:  Procedure Laterality Date  . dental implants     x2  . GYNECOLOGIC CRYOSURGERY  1980s  . LAPAROSCOPIC CHOLECYSTECTOMY    . LASIK    . MOHS SURGERY    . TONSILLECTOMY      MEDICATIONS: Current Outpatient Medications on File Prior to Visit  Medication Sig Dispense Refill  . famotidine (PEPCID) 20 MG tablet Take 20 mg by mouth daily.    . fexofenadine (ALLEGRA) 180 MG tablet Take by mouth daily as needed.    . fluticasone (FLONASE) 50 MCG/ACT nasal spray Place 2 sprays into the nose as needed for rhinitis.    . NONFORMULARY OR COMPOUNDED ITEM Estradiol 0.02% cream with Vit E added.  10ml pv two times weekly.  24 preloaded syringes. 24 each 3  . pantoprazole (PROTONIX) 40 MG tablet Take 1 tablet (40 mg total) by mouth daily. 90 tablet 4  . polyethylene glycol (MIRALAX / GLYCOLAX) 17 g packet Take 17 g by mouth as needed.    Marland Kitchen SYNTHROID 75 MCG  tablet Take 1 tablet by mouth daily.    Marland Kitchen topiramate (TOPAMAX) 50 MG tablet Take 125 mg by mouth daily.     . traZODone (DESYREL) 50 MG tablet Take 1 tablet by mouth at bedtime.  3  . valACYclovir (VALTREX) 500 MG tablet Take 1 tablet (500 mg total) by mouth daily. 90 tablet 4   No current facility-administered medications on file prior to visit.    ALLERGIES: Allergies  Allergen Reactions  . Penicillins     REACTION: reactive on  allergy test  . Sulfonamide Derivatives     REACTION: rash  . Prolia [Denosumab] Itching    FAMILY HISTORY: Family History  Problem Relation Age of Onset  . Lung cancer Mother   . Thyroid disease Mother   . Cancer Other        maternal side-lung cancer and melanoma  . Hypertension Paternal Grandfather   . Heart attack Father   . Stroke Other        maternal side  . Bipolar disorder Brother    SOCIAL HISTORY: Social History   Socioeconomic History  . Marital status: Married    Spouse name: Not on file  . Number of children: Not on file  . Years of education: Not on file  . Highest education level: Not on file  Occupational History  . Not on file  Tobacco Use  . Smoking status: Never Smoker  . Smokeless tobacco: Never Used  Vaping Use  . Vaping Use: Never used  Substance and Sexual Activity  . Alcohol use: No  . Drug use: No  . Sexual activity: Yes    Partners: Male    Birth control/protection: Post-menopausal  Other Topics Concern  . Not on file  Social History Narrative  . Not on file   Social Determinants of Health   Financial Resource Strain:   . Difficulty of Paying Living Expenses:   Food Insecurity:   . Worried About Charity fundraiser in the Last Year:   . Arboriculturist in the Last Year:   Transportation Needs:   . Film/video editor (Medical):   Marland Kitchen Lack of Transportation (Non-Medical):   Physical Activity:   . Days of Exercise per Week:   . Minutes of Exercise per Session:   Stress:   . Feeling of Stress :   Social Connections:   . Frequency of Communication with Friends and Family:   . Frequency of Social Gatherings with Friends and Family:   . Attends Religious Services:   . Active Member of Clubs or Organizations:   . Attends Archivist Meetings:   Marland Kitchen Marital Status:   Intimate Partner Violence:   . Fear of Current or Ex-Partner:   . Emotionally Abused:   Marland Kitchen Physically Abused:   . Sexually Abused:     PHYSICAL  EXAM: Blood pressure (!) 145/87, pulse 73, height 5\' 3"  (1.6 m), weight 131 lb (59.4 kg), last menstrual period 10/20/2010, SpO2 98 %. General: No acute distress.  Patient appears well-groomed.   Head:  Normocephalic/atraumatic Eyes:  fundi examined but not visualized Neck: supple, no paraspinal tenderness, full range of motion Back: No paraspinal tenderness Heart: regular rate and rhythm Lungs: Clear to auscultation bilaterally. Vascular: No carotid bruits. Neurological Exam: Mental status: alert and oriented to person, place, and time, recent and remote memory intact, fund of knowledge intact, attention and concentration intact, speech fluent and not dysarthric, language intact. Cranial nerves: CN I: not tested CN II:  pupils equal, round and reactive to light, visual fields intact CN III, IV, VI:  full range of motion, no nystagmus, no ptosis CN V: facial sensation intact CN VII: upper and lower face symmetric CN VIII: hearing intact CN IX, X: gag intact, uvula midline CN XI: sternocleidomastoid and trapezius muscles intact CN XII: tongue midline Bulk & Tone: normal, no fasciculations. Motor:  5/5 throughout  Sensation:  Pinprick and vibration sensation intact. Deep Tendon Reflexes:  2+ throughout, toes downgoing.   Finger to nose testing:  Without dysmetria.   Heel to shin:  Without dysmetria.   Gait:  Normal station and stride.  Romberg negative.  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  PLAN: Plan is to transition from topiramate to nortriptyline  1.  For preventative management, will start nortriptyline 10mg  at bedtime.  We can increase to 25mg  at bedtime in 4 weeks if needed.  Continue topiramate for now. 2.  For abortive therapy, rizatriptan.  Stop Tylenol and ibuprofen. 3.  Limit use of pain relievers to no more than 2 days out of week to prevent risk of rebound or  medication-overuse headache. 4.  Keep headache diary 5.  Exercise, hydration, caffeine cessation, sleep hygiene, monitor for and avoid triggers 6.  Follow up 6 months.   Thank you for allowing me to take part in the care of this patient.  Metta Clines, DO  CC: Ernestene Kiel, MD

## 2019-12-25 ENCOUNTER — Other Ambulatory Visit: Payer: Self-pay

## 2019-12-25 ENCOUNTER — Encounter: Payer: Self-pay | Admitting: Neurology

## 2019-12-25 ENCOUNTER — Ambulatory Visit (INDEPENDENT_AMBULATORY_CARE_PROVIDER_SITE_OTHER): Payer: Commercial Managed Care - PPO | Admitting: Neurology

## 2019-12-25 VITALS — BP 145/87 | HR 73 | Ht 63.0 in | Wt 131.0 lb

## 2019-12-25 DIAGNOSIS — G43709 Chronic migraine without aura, not intractable, without status migrainosus: Secondary | ICD-10-CM

## 2019-12-25 MED ORDER — RIZATRIPTAN BENZOATE 10 MG PO TABS
ORAL_TABLET | ORAL | 3 refills | Status: DC
Start: 2019-12-25 — End: 2021-04-22

## 2019-12-25 MED ORDER — NORTRIPTYLINE HCL 10 MG PO CAPS: 10.0000 mg | ORAL_CAPSULE | Freq: Every day | ORAL | 3 refills | Status: DC

## 2019-12-25 NOTE — Patient Instructions (Signed)
  1. Start nortriptyline 10mg  at bedtime.  Contact me in 4 weeks with update and we can increase dose if needed.  Continue topiramate for now. 2. Take rizatriptan 10mg  at earliest onset of headache.  May repeat dose once in 2 hours if needed.  Maximum 2 tablets in 24 hours.   3. STOP IBUPROFEN AND TYLENOL 4. Limit use of pain relievers to no more than 2 days out of the week.  These medications include acetaminophen, NSAIDs (ibuprofen/Advil/Motrin, naproxen/Aleve, triptans (Imitrex/sumatriptan), Excedrin, and narcotics.  This will help reduce risk of rebound headaches. 5. Be aware of common food triggers:  - Caffeine:  coffee, black tea, cola, Mt. Dew  - Chocolate  - Dairy:  aged cheeses (brie, blue, cheddar, gouda, [place], provolone, [place], Swiss, etc), chocolate milk, buttermilk, sour cream, limit eggs and yogurt  - Nuts, peanut butter  - Alcohol  - Cereals/grains:  FRESH breads (fresh bagels, sourdough, doughnuts), yeast productions  - Processed/canned/aged/cured meats (pre-packaged deli meats, hotdogs)  - MSG/glutamate:  soy sauce, flavor enhancer, pickled/preserved/marinated foods  - Sweeteners:  aspartame (Equal, Nutrasweet).  Sugar and Splenda are okay  - Vegetables:  legumes (lima beans, lentils, snow peas, fava beans, pinto peans, peas, garbanzo beans), sauerkraut, onions, olives, pickles  - Fruit:  avocados, bananas, citrus fruit (orange, lemon, grapefruit), mango  - Other:  Frozen meals, macaroni and cheese 6. Routine exercise 7. Stay adequately hydrated (aim for 64 oz water daily) 8. Keep headache diary 9. Maintain proper stress management 10. Maintain proper sleep hygiene 11. Do not skip meals 12. Consider supplements:  magnesium citrate 400mg  daily, riboflavin 400mg  daily, coenzyme Q10 100mg  three times daily.

## 2020-01-01 ENCOUNTER — Encounter: Payer: Self-pay | Admitting: Gastroenterology

## 2020-01-04 ENCOUNTER — Encounter: Payer: Self-pay | Admitting: Gastroenterology

## 2020-01-04 ENCOUNTER — Other Ambulatory Visit: Payer: Self-pay

## 2020-01-04 ENCOUNTER — Ambulatory Visit (AMBULATORY_SURGERY_CENTER): Payer: Commercial Managed Care - PPO | Admitting: Gastroenterology

## 2020-01-04 VITALS — BP 133/71 | HR 60 | Temp 97.1°F | Resp 22 | Ht 63.0 in | Wt 129.0 lb

## 2020-01-04 DIAGNOSIS — K2901 Acute gastritis with bleeding: Secondary | ICD-10-CM | POA: Diagnosis not present

## 2020-01-04 DIAGNOSIS — K222 Esophageal obstruction: Secondary | ICD-10-CM

## 2020-01-04 DIAGNOSIS — K449 Diaphragmatic hernia without obstruction or gangrene: Secondary | ICD-10-CM | POA: Diagnosis not present

## 2020-01-04 DIAGNOSIS — K2100 Gastro-esophageal reflux disease with esophagitis, without bleeding: Secondary | ICD-10-CM | POA: Diagnosis not present

## 2020-01-04 DIAGNOSIS — K219 Gastro-esophageal reflux disease without esophagitis: Secondary | ICD-10-CM

## 2020-01-04 DIAGNOSIS — R1310 Dysphagia, unspecified: Secondary | ICD-10-CM | POA: Diagnosis not present

## 2020-01-04 DIAGNOSIS — K317 Polyp of stomach and duodenum: Secondary | ICD-10-CM | POA: Diagnosis not present

## 2020-01-04 MED ORDER — LANSOPRAZOLE 30 MG PO CPDR: 30.0000 mg | DELAYED_RELEASE_CAPSULE | Freq: Every day | ORAL | 3 refills | Status: DC

## 2020-01-04 MED ORDER — SODIUM CHLORIDE 0.9 % IV SOLN: 500.0000 mL | Freq: Once | INTRAVENOUS | Status: DC

## 2020-01-04 MED ORDER — SUCRALFATE 1 G PO TABS: 1.0000 g | ORAL_TABLET | Freq: Two times a day (BID) | ORAL | 3 refills | Status: DC

## 2020-01-04 NOTE — Op Note (Signed)
[facility] Patient Name: Jaclyn Alexander Procedure Date: 01/04/2020 10:00 AM MRN: [id number] Endoscopist: Mauri Pole , MD Age: 58 Referring MD:  Date of Birth: 09/25/1960 Gender: Female Account #: [id number] Procedure:                Upper GI endoscopy Indications:              Dysphagia, Epigastric abdominal pain, Esophageal                            reflux symptoms that persist despite appropriate                            therapy Medicines:                Monitored Anesthesia Care Procedure:                Pre-Anesthesia Assessment:                           - Prior to the procedure, a History and Physical                            was performed, and patient medications and                            allergies were reviewed. The patient's tolerance of                            previous anesthesia was also reviewed. The risks                            and benefits of the procedure and the sedation                            options and risks were discussed with the patient.                            All questions were answered, and informed consent                            was obtained. Prior Anticoagulants: The patient has                            taken no previous anticoagulant or antiplatelet                            agents. ASA Grade Assessment: II - A patient with                            mild systemic disease. After reviewing the risks                            and benefits, the patient was deemed in  satisfactory condition to undergo the procedure.                           After obtaining informed consent, the endoscope was                            passed under direct vision. Throughout the                            procedure, the patient's blood pressure, pulse, and                            oxygen saturations were monitored continuously. The                            Endoscope was introduced through the mouth,  and                            advanced to the second part of duodenum. The upper                            GI endoscopy was accomplished without difficulty.                            The patient tolerated the procedure well. Scope In: Scope Out: Findings:                 A 5 cm hiatal hernia was present.                           LA Grade B (one or more mucosal breaks greater than                            5 mm, not extending between the tops of two mucosal                            folds) esophagitis with no bleeding was found 34 to                            35 cm from the incisors.                           One benign-appearing, intrinsic mild                            (non-circumferential scarring) stenosis was found                            33 to 34 cm from the incisors. This stenosis                            measured less than one cm (in length). The stenosis  was traversed. A TTS dilator was passed through the                            scope. Dilation with an 18-19-20 mm balloon dilator                            was performed to 20 mm.                           Patchy severe inflammation with hemorrhage                            characterized by congestion (edema), erosions,                            erythema and friability was found in the entire                            examined stomach. Biopsies were taken with a cold                            forceps for Helicobacter pylori testing.                           A single 5 mm sessile polyp with stigmata of recent                            bleeding was found in the gastric body. The polyp                            was removed with a cold snare. Resection and                            retrieval were complete.                           The examined duodenum was normal. Complications:            No immediate complications. Estimated Blood Loss:     Estimated blood loss was  minimal. Impression:               - 5 cm hiatal hernia.                           - LA Grade B reflux esophagitis with no bleeding.                           - Benign-appearing esophageal stenosis. Dilated.                           - Gastritis with hemorrhage. Biopsied.                           - A single gastric polyp. Resected and retrieved.                           -  Normal examined duodenum. Recommendation:           - Patient has a contact number available for                            emergencies. The signs and symptoms of potential                            delayed complications were discussed with the                            patient. Return to normal activities tomorrow.                            Written discharge instructions were provided to the                            patient.                           - Resume previous diet.                           - Continue present medications.                           - Await pathology results.                           - No aspirin, ibuprofen, naproxen, or other                            non-steroidal anti-inflammatory drugs.                           - Follow an antireflux regimen indefinitely.                           - Use Protonix (pantoprazole) 40 mg PO daily.                           - Use sucralfate tablets 1 gram PO BID for 3 months. Mauri Pole, MD 01/04/2020 10:46:40 AM This report has been signed electronically.

## 2020-01-04 NOTE — Progress Notes (Signed)
PT taken to PACU. Monitors in place. VSS. Report given to RN. 

## 2020-01-04 NOTE — Patient Instructions (Addendum)
Impression/Recommendations:  Hiatal hernia handout given to patient. Esophagitis handout given to patient. Gastritis handout given to patient. GERD handout given to patient.  Resume previous diet. Continue present medications. Await pathology results.  No aspirin, ibuprofen, naproxen, or other NSAID drugs.  Follow antireflux regimen.  Prevacid 30 mg. Daily.    Use Sucralfate tablets 1 gram by mouth 2 times daily for 3 months.  YOU HAD AN ENDOSCOPIC PROCEDURE TODAY AT [facility]:   Refer to the procedure report that was given to you for any specific questions about what was found during the examination.  If the procedure report does not answer your questions, please call your gastroenterologist to clarify.  If you requested that your care partner not be given the details of your procedure findings, then the procedure report has been included in a sealed envelope for you to review at your convenience later.  YOU SHOULD EXPECT: Some feelings of bloating in the abdomen. Passage of more gas than usual.  Walking can help get rid of the air that was put into your GI tract during the procedure and reduce the bloating. If you had a lower endoscopy (such as a colonoscopy or flexible sigmoidoscopy) you may notice spotting of blood in your stool or on the toilet paper. If you underwent a bowel prep for your procedure, you may not have a normal bowel movement for a few days.  Please Note:  You might notice some irritation and congestion in your nose or some drainage.  This is from the oxygen used during your procedure.  There is no need for concern and it should clear up in a day or so.  SYMPTOMS TO REPORT IMMEDIATELY:  Following upper endoscopy (EGD)  Vomiting of blood or coffee ground material  New chest pain or pain under the shoulder blades  Painful or persistently difficult swallowing  New shortness of breath  Fever of 100F or higher  Black, tarry-looking stools  For  urgent or emergent issues, a gastroenterologist can be reached at any hour by calling [phone]. Do not use MyChart messaging for urgent concerns.    DIET:  We do recommend a small meal at first, but then you may proceed to your regular diet.  Drink plenty of fluids but you should avoid alcoholic beverages for 24 hours.  ACTIVITY:  You should plan to take it easy for the rest of today and you should NOT DRIVE or use heavy machinery until tomorrow (because of the sedation medicines used during the test).    FOLLOW UP: Our staff will call the number listed on your records 48-72 hours following your procedure to check on you and address any questions or concerns that you may have regarding the information given to you following your procedure. If we do not reach you, we will leave a message.  We will attempt to reach you two times.  During this call, we will ask if you have developed any symptoms of COVID 19. If you develop any symptoms (ie: fever, flu-like symptoms, shortness of breath, cough etc.) before then, please call [phone].  If you test positive for Covid 19 in the 2 weeks post procedure, please call and report this information to [place].    If any biopsies were taken you will be contacted by phone or by letter within the next 1-3 weeks.  Please call us at [phone] if you have not heard about the biopsies in 3 weeks.    SIGNATURES/CONFIDENTIALITY: You and/or your  care partner have signed paperwork which will be entered into your electronic medical record.  These signatures attest to the fact that that the information above on your After Visit Summary has been reviewed and is understood.  Full responsibility of the confidentiality of this discharge information lies with you and/or your care-partner.

## 2020-01-04 NOTE — Progress Notes (Signed)
Pt's states no medical or surgical changes since previsit or office visit.  VS CW  

## 2020-01-08 ENCOUNTER — Telehealth: Payer: Self-pay

## 2020-01-08 NOTE — Telephone Encounter (Signed)
°  Follow up Call-  Call back number 01/04/2020  Post procedure Call Back phone  # [phone]  Permission to leave phone message Yes  Some recent data might be hidden     Patient questions:  Do you have a fever, pain , or abdominal swelling? No. Pain Score  0 *  Have you tolerated food without any problems? Yes.    Have you been able to return to your normal activities? Yes.    Do you have any questions about your discharge instructions: Diet   No. Medications  No. Follow up visit  No.  Do you have questions or concerns about your Care? Yes.    Actions: * If pain score is 4 or above: No action needed, pain <4.   Pt asked if she was supposed to follow up with Dr. Silverio Decamp in the office in 3 months.  I told pt I did not see that on the procedure report but would send you a note.  Please advise.      1. Have you developed a fever since your procedure? no  2.   Have you had an respiratory symptoms (SOB or cough) since your procedure? no  3.   Have you tested positive for COVID 19 since your procedure no  4.   Have you had any family members/close contacts diagnosed with the COVID 19 since your procedure?  no   If yes to any of these questions please route to Joylene John, RN and Erenest Rasher, RN

## 2020-01-08 NOTE — Telephone Encounter (Signed)
Per Dr. Silverio Decamp, pt is to follow up in the office in 3 months.  She also said the schedule is not out that far now.  To have the pt call the office in 1 month to set up the appointment.  I left this message on the pt's answering machine for her. maw

## 2020-01-09 NOTE — Telephone Encounter (Signed)
Thank you :)

## 2020-01-15 ENCOUNTER — Encounter: Payer: Self-pay | Admitting: Gastroenterology

## 2020-02-23 ENCOUNTER — Other Ambulatory Visit: Payer: Self-pay | Admitting: Gastroenterology

## 2020-04-08 NOTE — Progress Notes (Signed)
59 y.o. G1P1 Married White or Caucasian female here for annual exam.  Doing well.  Jaclyn Alexander is 3 and in PreK.  Will start kindergarten next year.  [place]    PCP:  Dr. Laqueta Due.  In [place].    Patient's last menstrual period was 10/20/2010.          Sexually active: No.  The current method of family planning is post menopausal status.    Exercising: Yes.    walking Smoker:  no  Health Maintenance: Pap:  10-26-16 neg HPV HR neg History of abnormal Pap:  yes MMG:  09-27-2019 category c density birads 1:neg Colonoscopy:  2015 BMD:   2019 osteoporosis TDaP:  2012 Pneumonia vaccine(s):  no Shingrix:   no Hep C testing: done with pcp Screening Labs: Last appt was earlier this year.     reports that she has never smoked. She has never used smokeless tobacco. She reports that she does not drink alcohol and does not use drugs.  Past Medical History:  Diagnosis Date  . Acid reflux   . Allergy    seasonal  . Anemia   . Basal cell carcinoma 1/13  . HSV-2 infection   . Melanoma ([place]) 5/10   basal cell  . Migraine   . Osteoporosis   . Thyroid disease     Past Surgical History:  Procedure Laterality Date  . BASAL CELL CARCINOMA EXCISION    . COLONOSCOPY  2015  . dental implants     x2  . GYNECOLOGIC CRYOSURGERY  1980s  . LAPAROSCOPIC CHOLECYSTECTOMY    . LASIK    . MOHS SURGERY    . TONSILLECTOMY    . UPPER GASTROINTESTINAL ENDOSCOPY  2015    Current Outpatient Medications  Medication Sig Dispense Refill  . fexofenadine (ALLEGRA) 180 MG tablet Take by mouth daily as needed.    . fluticasone (FLONASE) 50 MCG/ACT nasal spray Place 2 sprays into the nose as needed for rhinitis.    Marland Kitchen lansoprazole (PREVACID) 30 MG capsule TAKE 1 CAPSULE (30 MG TOTAL) BY MOUTH DAILY AT 12 NOON. 30 capsule 3  . NONFORMULARY OR COMPOUNDED ITEM Estradiol 0.02% cream with Vit E added.  57ml pv two times weekly.  24 preloaded syringes. 24 each 3  . polyethylene  glycol (MIRALAX / GLYCOLAX) 17 g packet Take 17 g by mouth as needed.    . rizatriptan (MAXALT) 10 MG tablet Take 1 tablet earliest onset of migraine.  May repeat in 2 hours if needed.  Maximum 2 tablets in 24 hours. 10 tablet 3  . sucralfate (CARAFATE) 1 g tablet Take 1 tablet (1 g total) by mouth 2 (two) times daily. (Patient taking differently: Take 1 g by mouth as needed. ) 60 tablet 3  . SYNTHROID 75 MCG tablet Take 1 tablet by mouth daily.    Marland Kitchen topiramate (TOPAMAX) 50 MG tablet Take 125 mg by mouth daily.     . valACYclovir (VALTREX) 500 MG tablet Take 1 tablet (500 mg total) by mouth daily. (Patient taking differently: Take 500 mg by mouth as needed. ) 90 tablet 4   No current facility-administered medications for this visit.    Family History  Problem Relation Age of Onset  . Lung cancer Mother   . Thyroid disease Mother   . Cancer Other        maternal side-lung cancer and melanoma  . Hypertension Paternal Grandfather   . Heart attack Father   .  Stroke Other        maternal side  . Bipolar disorder Brother     Review of Systems  Constitutional: Negative.   HENT: Negative.   Eyes: Negative.   Respiratory: Negative.   Cardiovascular: Negative.   Gastrointestinal: Negative.   Endocrine: Negative.   Genitourinary: Negative.   Musculoskeletal: Negative.   Skin: Negative.   Allergic/Immunologic: Negative.   Neurological: Negative.   Hematological: Negative.   Psychiatric/Behavioral: Negative.     Exam:   BP 118/76   Pulse 68   Resp 16   Ht 5\' 3"  (1.6 m)   Wt 131 lb (59.4 kg)   LMP 10/20/2010   BMI 23.21 kg/m   Height: 5\' 3"  (160 cm)  General appearance: alert, cooperative and appears stated age Head: Normocephalic, without obvious abnormality, atraumatic Neck: no adenopathy, supple, symmetrical, trachea midline and thyroid normal to inspection and palpation Lungs: clear to auscultation bilaterally Breasts: normal appearance, no masses or tenderness Heart:  regular rate and rhythm Abdomen: soft, non-tender; bowel sounds normal; no masses,  no organomegaly Extremities: extremities normal, atraumatic, no cyanosis or edema Skin: Skin color, texture, turgor normal. No rashes or lesions Lymph nodes: Cervical, supraclavicular, and axillary nodes normal. No abnormal inguinal nodes palpated Neurologic: Grossly normal   Pelvic: External genitalia:  no lesions              Urethra:  normal appearing urethra with no masses, tenderness or lesions              Bartholins and Skenes: normal                 Vagina: normal appearing vagina with normal color and discharge, no lesions              Cervix: no lesions              Pap taken: No. Bimanual Exam:  Uterus:  normal size, contour, position, consistency, mobility, non-tender              Adnexa: normal adnexa and no mass, fullness, tenderness               Rectovaginal: Confirms               Anus:  normal sphincter tone, no lesions  Chaperone, Terence Lux, CMA, was present for exam.  A:  Well Woman with normal exam PMP, no HRT Hypothyroidism Mild SUI Atrophic changes H/o uterine fibroids Osteoporosis, received 4 doses of Prolia GERD  H/o HSV  P:   Mammogram guidelines reviewed.  Doing yearly. pap smear with HR HPV obtained today Did go to [facility] endocrinology for consultation and to consider Reclast Lab work done with Dr. Laqueta Due Colonoscopy due 2025 RF for Valtrex 500mg  daily.  #90/4RF RF for estradiol 0.02% cream with Vit E to [name].  24 syringes.  4RF. Return annually or prn

## 2020-04-10 ENCOUNTER — Ambulatory Visit (INDEPENDENT_AMBULATORY_CARE_PROVIDER_SITE_OTHER): Payer: Commercial Managed Care - PPO | Admitting: Obstetrics & Gynecology

## 2020-04-10 ENCOUNTER — Other Ambulatory Visit: Payer: Self-pay

## 2020-04-10 ENCOUNTER — Encounter: Payer: Self-pay | Admitting: Obstetrics & Gynecology

## 2020-04-10 ENCOUNTER — Telehealth: Payer: Self-pay | Admitting: *Deleted

## 2020-04-10 ENCOUNTER — Other Ambulatory Visit (HOSPITAL_COMMUNITY)
Admission: RE | Admit: 2020-04-10 | Discharge: 2020-04-10 | Disposition: A | Discharge status: Home / Self Care | Payer: Commercial Managed Care - PPO | Source: Ambulatory Visit | Attending: Obstetrics & Gynecology | Admitting: Obstetrics & Gynecology

## 2020-04-10 VITALS — BP 118/76 | HR 68 | Resp 16 | Ht 63.0 in | Wt 131.0 lb

## 2020-04-10 DIAGNOSIS — Z124 Encounter for screening for malignant neoplasm of cervix: Secondary | ICD-10-CM

## 2020-04-10 DIAGNOSIS — Z01419 Encounter for gynecological examination (general) (routine) without abnormal findings: Secondary | ICD-10-CM | POA: Diagnosis not present

## 2020-04-10 MED ORDER — VALACYCLOVIR HCL 500 MG PO TABS: 500.0000 mg | ORAL_TABLET | Freq: Every day | ORAL | 4 refills | Status: DC

## 2020-04-10 MED ORDER — NONFORMULARY OR COMPOUNDED ITEM: 3 refills | Status: DC

## 2020-04-10 NOTE — Telephone Encounter (Signed)
Written order for BMD to Dr. Sabra Heck for signature.   Last BMD 02/08/18

## 2020-04-10 NOTE — Telephone Encounter (Signed)
-----   Message from Megan Salon, MD sent at 04/10/2020  9:29 AM EDT ----- Regarding: BMD Pt has osteoporosis and has declined treatment but willing to do another BMD.  This was done at [facility].  Can you schedule a new one for her?  Thank.s  Vinnie Level

## 2020-04-10 NOTE — Telephone Encounter (Signed)
BMD order faxed to [facility] at [phone].   Call placed to [facility], spoke with Isella.  BMD scheduled for 10/25 at 3pm, arrive at 2:30pm.  No multivitamins, calcium supplements or antacids 24 hours prior to appt.   Call placed to patient. Advised of appt and instructions as seen above. Patient verbalizes understanding and is agreeable.   Order to scan.   Encounter closed.

## 2020-04-11 LAB — CYTOLOGY - PAP
Comment: NEGATIVE
Diagnosis: NEGATIVE
High risk HPV: NEGATIVE

## 2020-04-22 ENCOUNTER — Encounter: Payer: Self-pay | Admitting: Gastroenterology

## 2020-04-22 ENCOUNTER — Ambulatory Visit (INDEPENDENT_AMBULATORY_CARE_PROVIDER_SITE_OTHER): Payer: Commercial Managed Care - PPO | Admitting: Gastroenterology

## 2020-04-22 VITALS — BP 124/86 | HR 70 | Ht 63.0 in | Wt 133.0 lb

## 2020-04-22 DIAGNOSIS — K5904 Chronic idiopathic constipation: Secondary | ICD-10-CM | POA: Diagnosis not present

## 2020-04-22 DIAGNOSIS — Z1211 Encounter for screening for malignant neoplasm of colon: Secondary | ICD-10-CM

## 2020-04-22 DIAGNOSIS — K219 Gastro-esophageal reflux disease without esophagitis: Secondary | ICD-10-CM | POA: Diagnosis not present

## 2020-04-22 MED ORDER — SUPREP BOWEL PREP KIT 17.5-3.13-1.6 GM/177ML PO SOLN: 1.0000 | Freq: Once | ORAL | 0 refills | Status: AC

## 2020-04-22 NOTE — Progress Notes (Signed)
Jaclyn Alexander    [id number]    09/10/1960  Primary Care Physician:Prochnau, Chrys Racer, MD  Referring Physician: Ernestene Kiel, MD [address] [place],  [place] [address]   Chief complaint: GERD, bloating, constipation alternating with diarrhea  HPI:  59 year old very pleasant female here for follow-up visit for chronic GERD IBS constipation alternating with diarrhea  GERD symptoms have improved significantly with daily Prevacid, she takes it before lunch every day.  No dysphagia or odynophagia.  She continues to have alternating constipation with diarrhea, is taking MiraLAX on average twice a week.  Her stool is soft and broken when she takes MiraLAX.  Denies any abdominal pain but continues to have significant abdominal bloating. No rectal bleeding or melena.  EGD 01/04/20: 5cm Hiatal hernia. LA Grade B esophagitis. Stricture s/p dilation to 33mm. Gastropathy, negative for H.pylori. Gastric polyp with stigmata of bleeding removed, hyperplastic based on pathology.   Colonoscopy by Dr. Sharlett Iles 11/20/2008: Melanosis coli otherwise normal exam.  Recommended recall colonoscopy in 10 years  EGD by Dr. Sharlett Iles Nov 06, 2008: For dysphagia 3 to 4 cm hiatal hernia.  Distal esophageal stricture dilated with Jacinto Halim French, distal esophagus negative for eosinophilic esophagitis or so metaplasia and gastric antral biopsies letter for H. pylori, had mild antral gastritis.  EGD with Dr Collene Mares 2015 Gastropathy otherwise normal.  Patient says she had colonoscopy in 2015 with Dr. Collene Mares.  Report is not available and was not sent over when we requested all prior endoscopies  Outpatient Encounter Medications as of 04/22/2020  Medication Sig  . fexofenadine (ALLEGRA) 180 MG tablet Take by mouth daily as needed.  . fluticasone (FLONASE) 50 MCG/ACT nasal spray Place 2 sprays into the nose as needed for rhinitis.  Marland Kitchen lansoprazole (PREVACID) 30 MG capsule TAKE 1 CAPSULE (30 MG TOTAL)  BY MOUTH DAILY AT 12 NOON.  . NONFORMULARY OR COMPOUNDED ITEM Estradiol 0.02% cream with Vit E added.  46ml pv two times weekly.  24 preloaded syringes.  . polyethylene glycol (MIRALAX / GLYCOLAX) 17 g packet Take 17 g by mouth as needed.  . rizatriptan (MAXALT) 10 MG tablet Take 1 tablet earliest onset of migraine.  May repeat in 2 hours if needed.  Maximum 2 tablets in 24 hours.  . sucralfate (CARAFATE) 1 g tablet Take 1 tablet (1 g total) by mouth 2 (two) times daily. (Patient taking differently: Take 1 g by mouth as needed. )  . SYNTHROID 75 MCG tablet Take 1 tablet by mouth daily.  Marland Kitchen topiramate (TOPAMAX) 50 MG tablet Take 125 mg by mouth daily.   . valACYclovir (VALTREX) 500 MG tablet Take 1 tablet (500 mg total) by mouth daily.  . [DISCONTINUED] pantoprazole (PROTONIX) 40 MG tablet Take 1 tablet (40 mg total) by mouth daily. (Patient not taking: Reported on 12/25/2019)   No facility-administered encounter medications on file as of 04/22/2020.    Allergies as of 04/22/2020 - Review Complete 04/22/2020  Allergen Reaction Noted  . Penicillins    . Sulfonamide derivatives    . Prolia [denosumab] Itching 10/26/2016    Past Medical History:  Diagnosis Date  . Acid reflux   . Allergy    seasonal  . Anemia   . Basal cell carcinoma 1/13  . HSV-2 infection   . Melanoma ([place]) 5/10   basal cell  . Migraine   . Osteoporosis   . Thyroid disease     Past Surgical History:  Procedure  Laterality Date  . BASAL CELL CARCINOMA EXCISION    . COLONOSCOPY  2015  . dental implants     x2  . GYNECOLOGIC CRYOSURGERY  1980s  . LAPAROSCOPIC CHOLECYSTECTOMY    . LASIK    . MOHS SURGERY    . TONSILLECTOMY    . UPPER GASTROINTESTINAL ENDOSCOPY  2015    Family History  Problem Relation Age of Onset  . Lung cancer Mother   . Thyroid disease Mother   . Cancer Other        maternal side-lung cancer and melanoma  . Hypertension Paternal Grandfather   . Heart attack Father   . Stroke Other         maternal side  . Bipolar disorder Brother     Social History   Socioeconomic History  . Marital status: Married    Spouse name: Not on file  . Number of children: Not on file  . Years of education: Not on file  . Highest education level: Not on file  Occupational History  . Not on file  Tobacco Use  . Smoking status: Never Smoker  . Smokeless tobacco: Never Used  Vaping Use  . Vaping Use: Never used  Substance and Sexual Activity  . Alcohol use: No  . Drug use: No  . Sexual activity: Yes    Partners: Male    Birth control/protection: Post-menopausal  Other Topics Concern  . Not on file  Social History Narrative   Right handed   Lives with husband one story home   Social Determinants of Health   Financial Resource Strain:   . Difficulty of Paying Living Expenses: Not on file  Food Insecurity:   . Worried About Charity fundraiser in the Last Year: Not on file  . Ran Out of Food in the Last Year: Not on file  Transportation Needs:   . Lack of Transportation (Medical): Not on file  . Lack of Transportation (Non-Medical): Not on file  Physical Activity:   . Days of Exercise per Week: Not on file  . Minutes of Exercise per Session: Not on file  Stress:   . Feeling of Stress : Not on file  Social Connections:   . Frequency of Communication with Friends and Family: Not on file  . Frequency of Social Gatherings with Friends and Family: Not on file  . Attends Religious Services: Not on file  . Active Member of Clubs or Organizations: Not on file  . Attends Archivist Meetings: Not on file  . Marital Status: Not on file  Intimate Partner Violence:   . Fear of Current or Ex-Partner: Not on file  . Emotionally Abused: Not on file  . Physically Abused: Not on file  . Sexually Abused: Not on file      Review of systems: All other review of systems negative except as mentioned in the HPI.   Physical Exam: Vitals:   04/22/20 0823  BP: 124/86  Pulse:  70   Body mass index is 23.56 kg/m. Gen:      No acute distress HEENT:  sclera anicteric Abd:      soft, non-tender; no palpable masses, no distension Ext:    No edema Neuro: alert and oriented x 3 Psych: normal mood and affect  Data Reviewed:  Reviewed labs, radiology imaging, old records and pertinent past GI work up   Assessment and Plan/Recommendations:  59 year old very pleasant female with chronic GERD, erosive esophagitis with change in bowel habits  with constipation alternating with diarrhea  Due for colorectal cancer screening, will proceed with colonoscopy for further evaluation The risks and benefits as well as alternatives of endoscopic procedure(s) have been discussed and reviewed. All questions answered. The patient agrees to proceed.  Advised patient to start taking MiraLAX half capful daily Continue with high-fiber diet and increase water intake  GERD: Continue Prevacid and antireflux measures  Return in 6 months or sooner if needed  This visit required >30 minutes of patient care (this includes precharting, chart review, review of results, face-to-face time used for counseling as well as treatment plan and follow-up. The patient was provided an opportunity to ask questions and all were answered. The patient agreed with the plan and demonstrated an understanding of the instructions.  Damaris Hippo , MD    CC: Ernestene Kiel, MD

## 2020-04-22 NOTE — Patient Instructions (Addendum)
If you are age 61 or older, your body mass index should be between 23-30. Your Body mass index is 23.56 kg/m. If this is out of the aforementioned range listed, please consider follow up with your Primary Care Provider.  If you are age 68 or younger, your body mass index should be between 19-25. Your Body mass index is 23.56 kg/m. If this is out of the aformentioned range listed, please consider follow up with your Primary Care Provider.    Due to recent changes in healthcare laws, you may see the results of your imaging and laboratory studies on MyChart before your provider has had a chance to review them.  We understand that in some cases there may be results that are confusing or concerning to you. Not all laboratory results come back in the same time frame and the provider may be waiting for multiple results in order to interpret others.  Please give [place] 48 hours in order for your provider to thoroughly review all the results before contacting the office for clarification of your results.   You have been scheduled for a colonoscopy. Please follow written instructions given to you at your visit today.  Please pick up your prep supplies at the pharmacy within the next 1-3 days. If you use inhalers (even only as needed), please bring them with you on the day of your procedure.   Take Miralax 1/2 capful daily  Continue Prevacid   Constipation, Adult Constipation is when a person:  Poops (has a bowel movement) fewer times in a week than normal.  Has a hard time pooping.  Has poop that is dry, hard, or bigger than normal. Follow these instructions at home: Eating and drinking   Eat foods that have a lot of fiber, such as: ? Fresh fruits and vegetables. ? Whole grains. ? Beans.  Eat less of foods that are high in fat, low in fiber, or overly processed, such as: ? [place] fries. ? Hamburgers. ? Cookies. ? Candy. ? Soda.  Drink enough fluid to keep your pee (urine) clear or pale  yellow. General instructions  Exercise regularly or as told by your doctor.  Go to the restroom when you feel like you need to poop. Do not hold it in.  Take over-the-counter and prescription medicines only as told by your doctor. These include any fiber supplements.  Do pelvic floor retraining exercises, such as: ? Doing deep breathing while relaxing your lower belly (abdomen). ? Relaxing your pelvic floor while pooping.  Watch your condition for any changes.  Keep all follow-up visits as told by your doctor. This is important. Contact a doctor if:  You have pain that gets worse.  You have a fever.  You have not pooped for 4 days.  You throw up (vomit).  You are not hungry.  You lose weight.  You are bleeding from the anus.  You have thin, pencil-like poop (stool). Get help right away if:  You have a fever, and your symptoms suddenly get worse.  You leak poop or have blood in your poop.  Your belly feels hard or bigger than normal (is bloated).  You have very bad belly pain.  You feel dizzy or you faint. This information is not intended to replace advice given to you by your health care provider. Make sure you discuss any questions you have with your health care provider. Document Revised: 05/20/2017 Document Reviewed: 11/26/2015 Elsevier Patient Education  2020 [employer].   Follow up in  6 months  I appreciate the  opportunity to care for you  Thank You   Harl Bowie , MD

## 2020-04-23 ENCOUNTER — Telehealth: Payer: Self-pay | Admitting: Gastroenterology

## 2020-04-23 NOTE — Telephone Encounter (Signed)
Called patient to inform her she can do the Miralax prep, sent her new miralax prep instructions through My Chart  Pt was ok with the Mirlalax prep

## 2020-05-13 ENCOUNTER — Other Ambulatory Visit: Payer: Self-pay

## 2020-05-13 ENCOUNTER — Encounter: Payer: Self-pay | Admitting: Gastroenterology

## 2020-05-13 ENCOUNTER — Ambulatory Visit (AMBULATORY_SURGERY_CENTER): Payer: Commercial Managed Care - PPO | Admitting: Gastroenterology

## 2020-05-13 ENCOUNTER — Other Ambulatory Visit: Payer: Self-pay | Admitting: Gastroenterology

## 2020-05-13 VITALS — BP 135/83 | HR 65 | Temp 97.9°F | Resp 12 | Ht 63.0 in | Wt 133.0 lb

## 2020-05-13 DIAGNOSIS — K635 Polyp of colon: Secondary | ICD-10-CM | POA: Diagnosis not present

## 2020-05-13 DIAGNOSIS — K621 Rectal polyp: Secondary | ICD-10-CM | POA: Diagnosis not present

## 2020-05-13 DIAGNOSIS — D122 Benign neoplasm of ascending colon: Secondary | ICD-10-CM

## 2020-05-13 DIAGNOSIS — D123 Benign neoplasm of transverse colon: Secondary | ICD-10-CM

## 2020-05-13 DIAGNOSIS — Z1211 Encounter for screening for malignant neoplasm of colon: Secondary | ICD-10-CM

## 2020-05-13 DIAGNOSIS — D129 Benign neoplasm of anus and anal canal: Secondary | ICD-10-CM

## 2020-05-13 DIAGNOSIS — D128 Benign neoplasm of rectum: Secondary | ICD-10-CM

## 2020-05-13 MED ORDER — SODIUM CHLORIDE 0.9 % IV SOLN: 500.0000 mL | Freq: Once | INTRAVENOUS | Status: DC

## 2020-05-13 NOTE — Op Note (Signed)
[name] Procedure Date: 05/13/2020 10:57 AM MRN: [id number] Endoscopist: Mauri Pole , MD Age: 59 Referring MD:  Date of Birth: 1961-03-12 Gender: Female Account #: [id number] Procedure:                Colonoscopy Indications:              Screening for colorectal malignant neoplasm Medicines:                Monitored Anesthesia Care Procedure:                Pre-Anesthesia Assessment:                           - Prior to the procedure, a History and Physical                            was performed, and patient medications and                            allergies were reviewed. The patient's tolerance of                            previous anesthesia was also reviewed. The risks                            and benefits of the procedure and the sedation                            options and risks were discussed with the patient.                            All questions were answered, and informed consent                            was obtained. Prior Anticoagulants: The patient has                            taken no previous anticoagulant or antiplatelet                            agents. ASA Grade Assessment: II - A patient with                            mild systemic disease. After reviewing the risks                            and benefits, the patient was deemed in                            satisfactory condition to undergo the procedure.                           After obtaining informed consent, the colonoscope  was passed under direct vision. Throughout the                            procedure, the patient's blood pressure, pulse, and                            oxygen saturations were monitored continuously. The                            0022 [employer] was introduced through the                            anus and advanced to the the cecum, identified by                             appendiceal orifice and ileocecal valve. The                            colonoscopy was performed without difficulty. The                            patient tolerated the procedure well. The quality                            of the bowel preparation was adequate. The                            ileocecal valve, appendiceal orifice, and rectum                            were photographed. Scope In: 11:11:47 AM Scope Out: 11:37:54 AM Scope Withdrawal Time: 0 hours 16 minutes 22 seconds  Total Procedure Duration: 0 hours 26 minutes 7 seconds  Findings:                 The perianal and digital rectal examinations were                            normal.                           Three sessile polyps were found in the transverse                            colon and ascending colon. The polyps were 4 to 6                            mm in size. These polyps were removed with a cold                            snare. Resection and retrieval were complete.                           Three sessile polyps were found in the rectum. The  polyps were 1 to 2 mm in size. These polyps were                            removed with a cold biopsy forceps. Resection and                            retrieval were complete.                           Non-bleeding external and internal hemorrhoids were                            found during retroflexion. The hemorrhoids were                            medium-sized. Complications:            No immediate complications. Estimated Blood Loss:     Estimated blood loss was minimal. Impression:               - Three 4 to 6 mm polyps in the transverse colon                            and in the ascending colon, removed with a cold                            snare. Resected and retrieved.                           - Three 1 to 2 mm polyps in the rectum, removed                            with a cold biopsy forceps. Resected and retrieved.                            - Non-bleeding external and internal hemorrhoids. Recommendation:           - Patient has a contact number available for                            emergencies. The signs and symptoms of potential                            delayed complications were discussed with the                            patient. Return to normal activities tomorrow.                            Written discharge instructions were provided to the                            patient.                           -  Resume previous diet.                           - Continue present medications.                           - Await pathology results.                           - Repeat colonoscopy in 3 - 5 years for                            surveillance based on pathology results. Mauri Pole, MD 05/13/2020 11:41:19 AM This report has been signed electronically.

## 2020-05-13 NOTE — Progress Notes (Signed)
Pt's states no medical or surgical changes since previsit or office visit.  CW - vitals 

## 2020-05-13 NOTE — Progress Notes (Signed)
To PACU< VSS. Report to [email or web address] 

## 2020-05-13 NOTE — Patient Instructions (Signed)
Await pathology  Please read over handouts about polyps and hemorrhoids  Continue your normal medications  Next colonoscopy in 3-5 years   YOU HAD AN ENDOSCOPIC PROCEDURE TODAY AT [facility]:   Refer to the procedure report that was given to you for any specific questions about what was found during the examination.  If the procedure report does not answer your questions, please call your gastroenterologist to clarify.  If you requested that your care partner not be given the details of your procedure findings, then the procedure report has been included in a sealed envelope for you to review at your convenience later.  YOU SHOULD EXPECT: Some feelings of bloating in the abdomen. Passage of more gas than usual.  Walking can help get rid of the air that was put into your GI tract during the procedure and reduce the bloating. If you had a lower endoscopy (such as a colonoscopy or flexible sigmoidoscopy) you may notice spotting of blood in your stool or on the toilet paper. If you underwent a bowel prep for your procedure, you may not have a normal bowel movement for a few days.  Please Note:  You might notice some irritation and congestion in your nose or some drainage.  This is from the oxygen used during your procedure.  There is no need for concern and it should clear up in a day or so.  SYMPTOMS TO REPORT IMMEDIATELY:   Following lower endoscopy (colonoscopy or flexible sigmoidoscopy):  Excessive amounts of blood in the stool  Significant tenderness or worsening of abdominal pains  Swelling of the abdomen that is new, acute  Fever of 100F or higher  For urgent or emergent issues, a gastroenterologist can be reached at any hour by calling [phone]. Do not use MyChart messaging for urgent concerns.    DIET:  We do recommend a small meal at first, but then you may proceed to your regular diet.  Drink plenty of fluids but you should avoid alcoholic beverages for 24  hours.  ACTIVITY:  You should plan to take it easy for the rest of today and you should NOT DRIVE or use heavy machinery until tomorrow (because of the sedation medicines used during the test).    FOLLOW UP: Our staff will call the number listed on your records 48-72 hours following your procedure to check on you and address any questions or concerns that you may have regarding the information given to you following your procedure. If we do not reach you, we will leave a message.  We will attempt to reach you two times.  During this call, we will ask if you have developed any symptoms of COVID 19. If you develop any symptoms (ie: fever, flu-like symptoms, shortness of breath, cough etc.) before then, please call [phone].  If you test positive for Covid 19 in the 2 weeks post procedure, please call and report this information to [place].    If any biopsies were taken you will be contacted by phone or by letter within the next 1-3 weeks.  Please call us at [phone] if you have not heard about the biopsies in 3 weeks.    SIGNATURES/CONFIDENTIALITY: You and/or your care partner have signed paperwork which will be entered into your electronic medical record.  These signatures attest to the fact that that the information above on your After Visit Summary has been reviewed and is understood.  Full responsibility of the confidentiality of this discharge information lies  with you and/or your care-partner.

## 2020-05-13 NOTE — Progress Notes (Signed)
Called to procedure room. Upon start of the colonoscopy it was noticed the  patient had a large bruise on her right hip/gluteus.    Called to room to assist during endoscopic procedure.  Patient ID and intended procedure confirmed with present staff. Received instructions for my participation in the procedure from the performing physician.

## 2020-05-14 ENCOUNTER — Telehealth: Payer: Self-pay

## 2020-05-14 NOTE — Telephone Encounter (Signed)
  Follow up Call-  Call back number 05/13/2020 01/04/2020  Post procedure Call Back phone  # [phone]  Permission to leave phone message Yes Yes  Some recent data might be hidden     Patient questions:  Do you have a fever, pain , or abdominal swelling? No. Pain Score  0 *  Have you tolerated food without any problems? Yes.    Have you been able to return to your normal activities? Yes.    Do you have any questions about your discharge instructions: Diet   No. Medications  No. Follow up visit  No.  Do you have questions or concerns about your Care? No.  Actions: * If pain score is 4 or above: No action needed, pain <4.  1. Have you developed a fever since your procedure? no  2.   Have you had an respiratory symptoms (SOB or cough) since your procedure? no  3.   Have you tested positive for COVID 19 since your procedure no  4.   Have you had any family members/close contacts diagnosed with the COVID 19 since your procedure?  no   If yes to any of these questions please route to Joylene John, RN and Joella Prince, RN

## 2020-06-02 ENCOUNTER — Encounter: Payer: Self-pay | Admitting: Gastroenterology

## 2020-06-24 NOTE — Progress Notes (Deleted)
NEUROLOGY FOLLOW UP OFFICE NOTE  Jaclyn Alexander [id number]   Subjective:  Jaclyn Alexander is a 60 year old right-handed female who follows up for migraines.  UPDATE: Started nortriptyline in July with plan to taper off of topiramate. Intensity:  *** Duration:  *** Frequency:  *** Current NSAIDS:  ibuprofen.  She has reflux so she would rather avoid NSAIDs. Current analgesics:  Tylenol Current triptans:  rizatriptan 10mg  Current ergotamine:  none Current anti-emetic:  none Current muscle relaxants:  none Current anti-anxiolytic:  none Current sleep aide:  none Current Antihypertensive medications:  none Current Antidepressant medications:  nortriptyline 10mg  at bedtime Current Anticonvulsant medications:  topiramate 75mg  (previously on 150mg , on topiramate for 11 years) Current anti-CGRP:  none Current Vitamins/Herbal/Supplements:  none Current Antihistamines/Decongestants:  Allegra Other therapy:  none Hormone/birth control:  estradiol  Caffeine:  Seldom drinks coffee.   Diet:  4 to 5 glasses water daily.  No soda.  Sometimes skips meals. Exercise:  Walks twice a day Depression:  Maybe a little; Anxiety:  yes Other pain:  no Sleep:  Trouble staying asleep.  Wakes up 2 or 3 AM.  Sometimes trouble falling back to sleep.  HISTORY: She started having headaches since a teenager.   They are moderate pressure pain under the eyes/maxilla or in back of head.  No associated nausea, vomiting, photophobia, phonophobia, visual disturbance, numbness or weakness.  Often lasts a day.  They occur 10 to 15 days a month.  Smells such as gasoline or perfumes triggers them.  Applying pressure helps.    Past NSAIDS:  none Past analgesics:  , BC Past abortive triptans:  none Past abortive ergotamine:  none Past muscle relaxants:  Skelaxin (helped relieve tension for the headache) Past anti-emetic:  none Past antihypertensive medications:  none Past antidepressant medications:   none Past anticonvulsant medications:  none Past anti-CGRP:  none Past vitamins/Herbal/Supplements:  none Past antihistamines/decongestants:  none Other past therapies:  none   Family history of headache:  Mother, sisters  PAST MEDICAL HISTORY: Past Medical History:  Diagnosis Date  . Acid reflux   . Allergy    seasonal  . Anemia   . Basal cell carcinoma 1/13  . HSV-2 infection   . Melanoma ([facility]) 5/10   basal cell  . Migraine   . Osteoporosis   . Thyroid disease     MEDICATIONS: Current Outpatient Medications on File Prior to Visit  Medication Sig Dispense Refill  . fexofenadine (ALLEGRA) 180 MG tablet Take by mouth daily as needed.    . fluticasone (FLONASE) 50 MCG/ACT nasal spray Place 2 sprays into the nose as needed for rhinitis.    lansoprazole (PREVACID) 30 MG capsule TAKE 1 CAPSULE (30 MG TOTAL) BY MOUTH DAILY AT 12 NOON. 30 capsule 3  . NONFORMULARY OR COMPOUNDED ITEM Estradiol 0.02% cream with Vit E added.  32ml pv two times weekly.  24 preloaded syringes. 24 each 3  . polyethylene glycol (MIRALAX / GLYCOLAX) 17 g packet Take 17 g by mouth as needed.    . rizatriptan (MAXALT) 10 MG tablet Take 1 tablet earliest onset of migraine.  May repeat in 2 hours if needed.  Maximum 2 tablets in 24 hours. 10 tablet 3  . sucralfate (CARAFATE) 1 g tablet Take 1 tablet (1 g total) by mouth 2 (two) times daily. (Patient taking differently: Take 1 g by mouth as needed. ) 60 tablet 3  . SYNTHROID 75 MCG tablet Take 1 tablet by mouth  daily.    . topiramate (TOPAMAX) 50 MG tablet Take 125 mg by mouth daily.     . valACYclovir (VALTREX) 500 MG tablet Take 1 tablet (500 mg total) by mouth daily. 90 tablet 4  . [DISCONTINUED] pantoprazole (PROTONIX) 40 MG tablet Take 1 tablet (40 mg total) by mouth daily. (Patient not taking: Reported on 12/25/2019) 90 tablet 4   No current facility-administered medications on file prior to visit.    ALLERGIES: Allergies  Allergen Reactions  .  Penicillins     REACTION: reactive on allergy test  . Sulfonamide Derivatives     REACTION: rash  . Prolia [Denosumab] Itching    FAMILY HISTORY: Family History  Problem Relation Age of Onset  . Lung cancer Mother   . Thyroid disease Mother   . Cancer Other        maternal side-lung cancer and melanoma  . Hypertension Paternal Grandfather   . Heart attack Father   . Stroke Other        maternal side  . Bipolar disorder Brother   . Colon cancer Neg Hx   . Esophageal cancer Neg Hx   . Stomach cancer Neg Hx   . Rectal cancer Neg Hx    SOCIAL HISTORY: Social History   Socioeconomic History  . Marital status: Married    Spouse name: Not on file  . Number of children: Not on file  . Years of education: Not on file  . Highest education level: Not on file  Occupational History  . Not on file  Tobacco Use  . Smoking status: Never Smoker  . Smokeless tobacco: Never Used  Vaping Use  . Vaping Use: Never used  Substance and Sexual Activity  . Alcohol use: No  . Drug use: No  . Sexual activity: Yes    Partners: Male    Birth control/protection: Post-menopausal  Other Topics Concern  . Not on file  Social History Narrative   Right handed   Lives with husband one story home   Social Determinants of Health   Financial Resource Strain: Not on file  Food Insecurity: Not on file  Transportation Needs: Not on file  Physical Activity: Not on file  Stress: Not on file  Social Connections: Not on file  Intimate Partner Violence: Not on file     Objective:  *** General: No acute distress.  Patient appears well-groomed.   Head:  Normocephalic/atraumatic Eyes:  Fundi examined but not visualized Neck: supple, no paraspinal tenderness, full range of motion Heart:  Regular rate and rhythm Lungs:  Clear to auscultation bilaterally Back: No paraspinal tenderness Neurological Exam: alert and oriented to person, place, and time. Attention span and concentration intact, recent  and remote memory intact, fund of knowledge intact.  Speech fluent and not dysarthric, language intact.  CN II-XII intact. Bulk and tone normal, muscle strength 5/5 throughout.  Sensation to light touch, temperature and vibration intact.  Deep tendon reflexes 2+ throughout, toes downgoing.  Finger to nose and heel to shin testing intact.  Gait normal, Romberg negative.   Assessment/Plan:   Migraine without aura, without status migrainosus, not intractable  1.  Migraine prevention:  *** 2.  Migraine rescue:  *** 3.  Limit use of pain relievers to no more than 2 days out of week to prevent risk of rebound or medication-overuse headache. 4.  Keep headache diary 5.  Follow up ***  Metta Clines, DO  CC: ***

## 2020-06-26 ENCOUNTER — Ambulatory Visit: Payer: Commercial Managed Care - PPO | Admitting: Neurology

## 2020-06-30 ENCOUNTER — Ambulatory Visit: Payer: Commercial Managed Care - PPO | Admitting: Neurology

## 2020-08-27 ENCOUNTER — Other Ambulatory Visit: Payer: Self-pay | Admitting: Obstetrics & Gynecology

## 2020-08-27 DIAGNOSIS — Z0000 Encounter for general adult medical examination without abnormal findings: Secondary | ICD-10-CM

## 2020-10-13 ENCOUNTER — Ambulatory Visit
Admission: RE | Admit: 2020-10-13 | Discharge: 2020-10-13 | Disposition: A | Discharge status: Home / Self Care | Payer: Commercial Managed Care - PPO | Source: Ambulatory Visit | Attending: Obstetrics & Gynecology | Admitting: Obstetrics & Gynecology

## 2020-10-13 ENCOUNTER — Inpatient Hospital Stay: Admission: RE | Admit: 2020-10-13 | Payer: Commercial Managed Care - PPO | Source: Ambulatory Visit

## 2020-10-13 DIAGNOSIS — Z0000 Encounter for general adult medical examination without abnormal findings: Secondary | ICD-10-CM

## 2020-11-26 ENCOUNTER — Ambulatory Visit: Payer: Commercial Managed Care - PPO | Admitting: Neurology

## 2021-02-09 ENCOUNTER — Other Ambulatory Visit: Payer: Self-pay | Admitting: Gastroenterology

## 2021-04-21 ENCOUNTER — Ambulatory Visit (HOSPITAL_BASED_OUTPATIENT_CLINIC_OR_DEPARTMENT_OTHER): Payer: Commercial Managed Care - PPO | Admitting: Obstetrics & Gynecology

## 2021-04-22 ENCOUNTER — Encounter (HOSPITAL_BASED_OUTPATIENT_CLINIC_OR_DEPARTMENT_OTHER): Payer: Self-pay | Admitting: Obstetrics & Gynecology

## 2021-04-22 ENCOUNTER — Ambulatory Visit (INDEPENDENT_AMBULATORY_CARE_PROVIDER_SITE_OTHER): Payer: Commercial Managed Care - PPO | Admitting: Obstetrics & Gynecology

## 2021-04-22 ENCOUNTER — Other Ambulatory Visit: Payer: Self-pay

## 2021-04-22 VITALS — BP 148/92 | HR 90 | Ht 63.0 in | Wt 131.0 lb

## 2021-04-22 DIAGNOSIS — Z23 Encounter for immunization: Secondary | ICD-10-CM | POA: Diagnosis not present

## 2021-04-22 DIAGNOSIS — Z01419 Encounter for gynecological examination (general) (routine) without abnormal findings: Secondary | ICD-10-CM

## 2021-04-22 DIAGNOSIS — N952 Postmenopausal atrophic vaginitis: Secondary | ICD-10-CM | POA: Diagnosis not present

## 2021-04-22 DIAGNOSIS — M810 Age-related osteoporosis without current pathological fracture: Secondary | ICD-10-CM

## 2021-04-22 DIAGNOSIS — B009 Herpesviral infection, unspecified: Secondary | ICD-10-CM

## 2021-04-22 DIAGNOSIS — Z1231 Encounter for screening mammogram for malignant neoplasm of breast: Secondary | ICD-10-CM

## 2021-04-22 DIAGNOSIS — Z780 Asymptomatic menopausal state: Secondary | ICD-10-CM | POA: Diagnosis not present

## 2021-04-22 MED ORDER — NONFORMULARY OR COMPOUNDED ITEM: 3 refills | Status: DC

## 2021-04-22 MED ORDER — VALACYCLOVIR HCL 500 MG PO TABS: 500.0000 mg | ORAL_TABLET | Freq: Every day | ORAL | 4 refills | Status: DC

## 2021-04-22 NOTE — Progress Notes (Signed)
60 y.o. G1P1 Married White or Caucasian female here for annual exam.  Still estranged from step son and his family  This is still a great sadness for her and she doesn't really understand why.   Has gotten closer to 57 yo granddaughter though and this is great.   Denies vaginal bleeding.  On valtrex.  Does need RF.    Isn't using compounded vaginal estrogen cream.  Is more watery and pt doesn't like it.  PCP:  Dr. Laqueta Due.  Has lab work this year.  I reviewed results on pt's phone (portal for EMR).  Is on Vit D.    Patient's last menstrual period was 10/20/2010.          Sexually active: No.  The current method of family planning is post menopausal status.    Exercising: Yes.     walking Smoker:  no  Health Maintenance: Pap:  04/10/2020 Negative History of abnormal Pap:  remote hx MMG:  10/13/2020 Negative Colonoscopy:  05/13/2020, polyps follow up 5 years BMD:   02/08/2018 TDaP:  2012, due Screening Labs: done 09/2020   reports that she has never smoked. She has never used smokeless tobacco. She reports that she does not drink alcohol and does not use drugs.  Past Medical History:  Diagnosis Date   Acid reflux    Allergy    seasonal   Anemia    Basal cell carcinoma 1/13   HSV-2 infection    Melanoma ([place]) 5/10   basal cell   Migraine    Osteoporosis    Thyroid disease     Past Surgical History:  Procedure Laterality Date   BASAL CELL CARCINOMA EXCISION     COLONOSCOPY  2015   dental implants     x2   GYNECOLOGIC CRYOSURGERY  1980s   LAPAROSCOPIC CHOLECYSTECTOMY     LASIK     MOHS SURGERY     TONSILLECTOMY     UPPER GASTROINTESTINAL ENDOSCOPY  2015    Current Outpatient Medications  Medication Sig Dispense Refill   fexofenadine (ALLEGRA) 180 MG tablet Take by mouth daily as needed.     fluticasone (FLONASE) 50 MCG/ACT nasal spray Place 2 sprays into the nose as needed for rhinitis.     lansoprazole (PREVACID) 30 MG capsule TAKE 1 CAPSULE DAILY 90 capsule 3    polyethylene glycol (MIRALAX / GLYCOLAX) 17 g packet Take 17 g by mouth as needed.     sucralfate (CARAFATE) 1 g tablet Take 1 tablet (1 g total) by mouth 2 (two) times daily. (Patient taking differently: Take 1 g by mouth as needed.) 60 tablet 3   SYNTHROID 75 MCG tablet Take 1 tablet by mouth daily.     topiramate (TOPAMAX) 50 MG tablet Take 125 mg by mouth daily.      NONFORMULARY OR COMPOUNDED ITEM Estradiol 0.02% cream with Vit E added.  7ml pv two times weekly.  24 preloaded syringes. 24 each 3   valACYclovir (VALTREX) 500 MG tablet Take 1 tablet (500 mg total) by mouth daily. 90 tablet 4   No current facility-administered medications for this visit.    Family History  Problem Relation Age of Onset   Lung cancer Mother    Thyroid disease Mother    Cancer Other        maternal side-lung cancer and melanoma   Hypertension Paternal Grandfather    Heart attack Father    Stroke Other        maternal side  Bipolar disorder Brother    Colon cancer Neg Hx    Esophageal cancer Neg Hx    Stomach cancer Neg Hx    Rectal cancer Neg Hx     Review of Systems  All other systems reviewed and are negative.  Exam:   BP (!) 148/92 (BP Location: Right Arm, Patient Position: Sitting, Cuff Size: Normal)   Pulse 90   Ht 5\' 3"  (1.6 m)   Wt 131 lb (59.4 kg)   LMP 10/20/2010   BMI 23.21 kg/m   Height: 5\' 3"  (160 cm)  General appearance: alert, cooperative and appears stated age Head: Normocephalic, without obvious abnormality, atraumatic Neck: no adenopathy, supple, symmetrical, trachea midline and thyroid normal to inspection and palpation Lungs: clear to auscultation bilaterally Breasts: normal appearance, no masses or tenderness Heart: regular rate and rhythm Abdomen: soft, non-tender; bowel sounds normal; no masses,  no organomegaly Extremities: extremities normal, atraumatic, no cyanosis or edema Skin: Skin color, texture, turgor normal. No rashes or lesions Lymph nodes:  Cervical, supraclavicular, and axillary nodes normal. No abnormal inguinal nodes palpated Neurologic: Grossly normal   Pelvic: External genitalia:  no lesions              Urethra:  normal appearing urethra with no masses, tenderness or lesions              Bartholins and Skenes: normal                 Vagina: normal appearing vagina with normal color and no discharge, no lesions              Cervix: no lesions              Pap taken: No. Bimanual Exam:  Uterus:   irregular, about 8 weeks, mobile, stable exam              Adnexa: normal adnexa               Rectovaginal: Confirms               Anus:  normal sphincter tone, no lesions  Chaperone, Octaviano Batty, CMA, was present for exam.  Assessment/Plan: 1. Well woman exam with routine gynecological exam - pap neg with eng HR HPV 2021 - MMG 10/2020 - BMD due - colonoscopy 04/2020 - lab work done with DR. Prochnau - care gaps reviewed/update  2. Postmenopausal - no HRT  3. HSV-2 infection - valACYclovir (VALTREX) 500 MG tablet; Take 1 tablet (500 mg total) by mouth daily.  Dispense: 90 tablet; Refill: 4  4. Vaginal atrophy - NONFORMULARY OR COMPOUNDED ITEM; Estradiol 0.02% cream with Vit E added.  22ml pv two times weekly.  24 preloaded syringes.  Dispense: 24 each; Refill: 3  5. Age-related osteoporosis without current pathological fracture - DG BONE DENSITY (DXA); Future

## 2021-09-09 ENCOUNTER — Ambulatory Visit (HOSPITAL_BASED_OUTPATIENT_CLINIC_OR_DEPARTMENT_OTHER)
Admission: RE | Admit: 2021-09-09 | Discharge: 2021-09-09 | Disposition: A | Discharge status: Home / Self Care | Payer: Commercial Managed Care - PPO | Source: Ambulatory Visit | Attending: Obstetrics & Gynecology | Admitting: Obstetrics & Gynecology

## 2021-09-09 ENCOUNTER — Other Ambulatory Visit: Payer: Self-pay

## 2021-09-09 DIAGNOSIS — M810 Age-related osteoporosis without current pathological fracture: Secondary | ICD-10-CM | POA: Insufficient documentation

## 2021-10-14 ENCOUNTER — Ambulatory Visit
Admission: RE | Admit: 2021-10-14 | Discharge: 2021-10-14 | Disposition: A | Discharge status: Home / Self Care | Payer: Commercial Managed Care - PPO | Source: Ambulatory Visit | Attending: Obstetrics & Gynecology | Admitting: Obstetrics & Gynecology

## 2021-10-14 DIAGNOSIS — Z1231 Encounter for screening mammogram for malignant neoplasm of breast: Secondary | ICD-10-CM

## 2021-10-16 ENCOUNTER — Other Ambulatory Visit: Payer: Self-pay | Admitting: Obstetrics & Gynecology

## 2021-10-16 DIAGNOSIS — R928 Other abnormal and inconclusive findings on diagnostic imaging of breast: Secondary | ICD-10-CM

## 2021-10-24 ENCOUNTER — Ambulatory Visit
Admission: RE | Admit: 2021-10-24 | Discharge: 2021-10-24 | Disposition: A | Discharge status: Home / Self Care | Payer: Commercial Managed Care - PPO | Source: Ambulatory Visit | Attending: Obstetrics & Gynecology | Admitting: Obstetrics & Gynecology

## 2021-10-24 DIAGNOSIS — R928 Other abnormal and inconclusive findings on diagnostic imaging of breast: Secondary | ICD-10-CM

## 2022-02-03 ENCOUNTER — Other Ambulatory Visit: Payer: Self-pay | Admitting: Gastroenterology

## 2022-04-26 ENCOUNTER — Encounter (HOSPITAL_BASED_OUTPATIENT_CLINIC_OR_DEPARTMENT_OTHER): Payer: Self-pay | Admitting: Obstetrics & Gynecology

## 2022-04-26 ENCOUNTER — Ambulatory Visit (INDEPENDENT_AMBULATORY_CARE_PROVIDER_SITE_OTHER): Payer: Commercial Managed Care - PPO | Admitting: Obstetrics & Gynecology

## 2022-04-26 VITALS — BP 138/89 | HR 71 | Ht 63.0 in | Wt 132.2 lb

## 2022-04-26 DIAGNOSIS — B009 Herpesviral infection, unspecified: Secondary | ICD-10-CM

## 2022-04-26 DIAGNOSIS — M810 Age-related osteoporosis without current pathological fracture: Secondary | ICD-10-CM

## 2022-04-26 DIAGNOSIS — Z658 Other specified problems related to psychosocial circumstances: Secondary | ICD-10-CM | POA: Diagnosis not present

## 2022-04-26 DIAGNOSIS — Z01419 Encounter for gynecological examination (general) (routine) without abnormal findings: Secondary | ICD-10-CM | POA: Diagnosis not present

## 2022-04-26 MED ORDER — VALACYCLOVIR HCL 500 MG PO TABS: 500.0000 mg | ORAL_TABLET | Freq: Every day | ORAL | 4 refills | Status: AC

## 2022-04-26 NOTE — Progress Notes (Signed)
61 y.o. G1P1 Married White or Caucasian female here for annual exam.  Medically, doing well.  Denies vaginal bleeding.  BMD discussed.  Has had trouble tolerating medications.  This was stable so we will monitor.    Saw Dr. Laqueta Due in April, 2023.  Did blood work then.  Patient's last menstrual period was 10/20/2010.          Sexually active: No.  The current method of family planning is post menopausal status.    Exercising: Yes.     Walking Smoker:  no  Health Maintenance: Pap:  04/10/2020 Negative History of abnormal Pap:  remote hx MMG:  10/14/2021 Additional imaging needed, follow up was normal.  Routine follow up recommended. Colonoscopy:  05/13/2020, follow up 5 years BMD:   09/09/2021.  T score -3.1.  Stable Screening Labs: does with Dr. Laqueta Due   reports that she has never smoked. She has never used smokeless tobacco. She reports that she does not drink alcohol and does not use drugs.  Past Medical History:  Diagnosis Date   Acid reflux    Allergy    seasonal   Anemia    Basal cell carcinoma 1/13   HSV-2 infection    Melanoma ([place]) 5/10   basal cell   Migraine    Osteoporosis    Thyroid disease     Past Surgical History:  Procedure Laterality Date   BASAL CELL CARCINOMA EXCISION     COLONOSCOPY  2015   dental implants     x2   GYNECOLOGIC CRYOSURGERY  1980s   LAPAROSCOPIC CHOLECYSTECTOMY     LASIK     MOHS SURGERY     TONSILLECTOMY     UPPER GASTROINTESTINAL ENDOSCOPY  2015    Current Outpatient Medications  Medication Sig Dispense Refill   fexofenadine (ALLEGRA) 180 MG tablet Take by mouth daily as needed.     fluticasone (FLONASE) 50 MCG/ACT nasal spray Place 2 sprays into the nose as needed for rhinitis.     lansoprazole (PREVACID) 30 MG capsule TAKE 1 CAPSULE DAILY 90 capsule 3   polyethylene glycol (MIRALAX / GLYCOLAX) 17 g packet Take 17 g by mouth as needed.     SYNTHROID 75 MCG tablet Take 1 tablet by mouth daily.     topiramate (TOPAMAX) 50  MG tablet Take 125 mg by mouth daily.      valACYclovir (VALTREX) 500 MG tablet Take 1 tablet (500 mg total) by mouth daily. 90 tablet 4   No current facility-administered medications for this visit.    Family History  Problem Relation Age of Onset   Lung cancer Mother    Thyroid disease Mother    Cancer Other        maternal side-lung cancer and melanoma   Hypertension Paternal Grandfather    Heart attack Father    Stroke Other        maternal side   Bipolar disorder Brother    Colon cancer Neg Hx    Esophageal cancer Neg Hx    Stomach cancer Neg Hx    Rectal cancer Neg Hx     ROS: Constitutional: negative Genitourinary:negative  Exam:   BP 138/89 (BP Location: Right Arm, Patient Position: Sitting, Cuff Size: Normal)   Pulse 71   Ht '5\' 3"'$  (1.6 m)   Wt 132 lb 3.2 oz (60 kg)   LMP 10/20/2010   BMI 23.42 kg/m   Height: '5\' 3"'$  (160 cm)  General appearance: alert, cooperative and appears stated age  Head: Normocephalic, without obvious abnormality, atraumatic Neck: no adenopathy, supple, symmetrical, trachea midline and thyroid normal to inspection and palpation Lungs: clear to auscultation bilaterally Breasts: normal appearance, no masses or tenderness Heart: regular rate and rhythm Abdomen: soft, non-tender; bowel sounds normal; no masses,  no organomegaly Extremities: extremities normal, atraumatic, no cyanosis or edema Skin: Skin color, texture, turgor normal. No rashes or lesions Lymph nodes: Cervical, supraclavicular, and axillary nodes normal. No abnormal inguinal nodes palpated Neurologic: Grossly normal   Pelvic: External genitalia:  no lesions              Urethra:  normal appearing urethra with no masses, tenderness or lesions              Bartholins and Skenes: normal                 Vagina: normal appearing vagina with normal color and no discharge, no lesions              Cervix: no lesions              Pap taken: No. Bimanual Exam:  Uterus:  normal  size, contour, position, consistency, mobility, non-tender              Adnexa: normal adnexa and no mass, fullness, tenderness               Rectovaginal: Confirms               Anus:  normal sphincter tone, no lesions  Chaperone, Octaviano Batty, CMA, was present for exam.  Assessment/Plan: 1. Well woman exam with routine gynecological exam - Pap smear neg 2021 - Mammogram 09/2021 with negative follow up - Colonoscopy 2021 - Bone mineral density 08/2021 - lab work done with Dr. Laqueta Due. - vaccines reviewed/updated  2. HSV-2 infection - valACYclovir (VALTREX) 500 MG tablet; Take 1 tablet (500 mg total) by mouth daily.  Dispense: 90 tablet; Refill: 4  3. Psychosocial stressors  4. Age-related osteoporosis without current pathological fracture - Will plan to repeat in 2025.

## 2022-05-10 ENCOUNTER — Encounter (HOSPITAL_BASED_OUTPATIENT_CLINIC_OR_DEPARTMENT_OTHER): Payer: Self-pay | Admitting: Obstetrics & Gynecology

## 2022-06-28 ENCOUNTER — Other Ambulatory Visit: Payer: Self-pay

## 2022-06-28 ENCOUNTER — Encounter (HOSPITAL_COMMUNITY): Payer: Self-pay | Admitting: Orthopedic Surgery

## 2022-06-28 NOTE — Progress Notes (Signed)
PCP - Ernestene Kiel, MD  ERAS Protcol - Clears until 1400  Anesthesia review: N  Patient verbally denies any shortness of breath, fever, cough and chest pain during phone call   -------------  SDW INSTRUCTIONS given:  Your procedure is scheduled on 06/29/22.  Report to [facility] Main Entrance "A" at 1435  and check in at the Admitting office.  Call this number if you have problems the morning of surgery:  [phone]   Remember:  Do not eat after midnight the night before your surgery  You may drink clear liquids until 1400 the afternoon of your surgery.   Clear liquids allowed are: Water, Non-Citrus Juices (without pulp), Carbonated Beverages, Clear Tea, Black Coffee Only, and Gatorade    Take these medicines the morning of surgery with A SIP OF WATER  lansoprazole (PREVACID)  SYNTHROID  acetaminophen (TYLENOL)-if needed fluticasone (FLONASE)-if needed  As of today, STOP taking any Aspirin (unless otherwise instructed by your surgeon) Aleve, Naproxen, Ibuprofen, Motrin, Advil, Goody's, BC's, all herbal medications, fish oil, and all vitamins.                      Do not wear jewelry, make up, or nail polish            Do not wear lotions, powders, perfumes/colognes, or deodorant.            Do not shave 48 hours prior to surgery.  Men may shave face and neck.            Do not bring valuables to the hospital.            [facility] is not responsible for any belongings or valuables.  Do NOT Smoke (Tobacco/Vaping) 24 hours prior to your procedure If you use a CPAP at night, you may bring all equipment for your overnight stay.   Contacts, glasses, dentures or bridgework may not be worn into surgery.      For patients admitted to the hospital, discharge time will be determined by your treatment team.   Patients discharged the day of surgery will not be allowed to drive home, and someone needs to stay with them for 24 hours.    Special instructions:   [place]-  Preparing For Surgery  Before surgery, you can play an important role. Because skin is not sterile, your skin needs to be as free of germs as possible. You can reduce the number of germs on your skin by washing with CHG (chlorahexidine gluconate) Soap before surgery.  CHG is an antiseptic cleaner which kills germs and bonds with the skin to continue killing germs even after washing.    Oral Hygiene is also important to reduce your risk of infection.  Remember - BRUSH YOUR TEETH THE MORNING OF SURGERY WITH YOUR REGULAR TOOTHPASTE  Please do not use if you have an allergy to CHG or antibacterial soaps. If your skin becomes reddened/irritated stop using the CHG.  Do not shave (including legs and underarms) for at least 48 hours prior to first CHG shower. It is OK to shave your face.  Please follow these instructions carefully.   Shower the NIGHT BEFORE SURGERY and the MORNING OF SURGERY with DIAL Soap.   Pat yourself dry with a CLEAN TOWEL.  Wear CLEAN PAJAMAS to bed the night before surgery  Place CLEAN SHEETS on your bed the night of your first shower and DO NOT SLEEP WITH PETS.   Day of Surgery: Please  shower morning of surgery  Wear Clean/Comfortable clothing the morning of surgery Do not apply any deodorants/lotions.   Remember to brush your teeth WITH YOUR REGULAR TOOTHPASTE.   Questions were answered. Patient verbalized understanding of instructions.

## 2022-06-29 ENCOUNTER — Ambulatory Visit (HOSPITAL_COMMUNITY): Payer: Commercial Managed Care - PPO

## 2022-06-29 ENCOUNTER — Other Ambulatory Visit: Payer: Self-pay

## 2022-06-29 ENCOUNTER — Ambulatory Visit (HOSPITAL_BASED_OUTPATIENT_CLINIC_OR_DEPARTMENT_OTHER): Payer: Commercial Managed Care - PPO | Admitting: Anesthesiology

## 2022-06-29 ENCOUNTER — Ambulatory Visit (HOSPITAL_COMMUNITY)
Admission: RE | Admit: 2022-06-29 | Discharge: 2022-06-29 | Disposition: A | Discharge status: Home / Self Care | Payer: Commercial Managed Care - PPO | Attending: Orthopedic Surgery | Admitting: Orthopedic Surgery

## 2022-06-29 ENCOUNTER — Encounter (HOSPITAL_COMMUNITY)
Admission: RE | Disposition: A | Discharge status: Home / Self Care | Payer: Self-pay | Source: Home / Self Care | Attending: Orthopedic Surgery

## 2022-06-29 ENCOUNTER — Encounter (HOSPITAL_COMMUNITY): Payer: Self-pay | Admitting: Orthopedic Surgery

## 2022-06-29 ENCOUNTER — Ambulatory Visit (HOSPITAL_COMMUNITY): Payer: Commercial Managed Care - PPO | Admitting: Anesthesiology

## 2022-06-29 DIAGNOSIS — F419 Anxiety disorder, unspecified: Secondary | ICD-10-CM | POA: Diagnosis not present

## 2022-06-29 DIAGNOSIS — S52501A Unspecified fracture of the lower end of right radius, initial encounter for closed fracture: Secondary | ICD-10-CM | POA: Diagnosis not present

## 2022-06-29 DIAGNOSIS — Z79899 Other long term (current) drug therapy: Secondary | ICD-10-CM | POA: Insufficient documentation

## 2022-06-29 DIAGNOSIS — K449 Diaphragmatic hernia without obstruction or gangrene: Secondary | ICD-10-CM | POA: Diagnosis not present

## 2022-06-29 DIAGNOSIS — K219 Gastro-esophageal reflux disease without esophagitis: Secondary | ICD-10-CM | POA: Diagnosis not present

## 2022-06-29 DIAGNOSIS — E039 Hypothyroidism, unspecified: Secondary | ICD-10-CM | POA: Insufficient documentation

## 2022-06-29 DIAGNOSIS — S52571A Other intraarticular fracture of lower end of right radius, initial encounter for closed fracture: Secondary | ICD-10-CM | POA: Diagnosis present

## 2022-06-29 DIAGNOSIS — Z79890 Hormone replacement therapy (postmenopausal): Secondary | ICD-10-CM | POA: Diagnosis not present

## 2022-06-29 DIAGNOSIS — X58XXXA Exposure to other specified factors, initial encounter: Secondary | ICD-10-CM | POA: Diagnosis not present

## 2022-06-29 DIAGNOSIS — Z85828 Personal history of other malignant neoplasm of skin: Secondary | ICD-10-CM | POA: Insufficient documentation

## 2022-06-29 HISTORY — DX: Hypothyroidism, unspecified: E03.9

## 2022-06-29 HISTORY — PX: OPEN REDUCTION INTERNAL FIXATION (ORIF) DISTAL RADIAL FRACTURE: SHX5989

## 2022-06-29 HISTORY — PX: FLEXOR TENOTOMY: SHX6342

## 2022-06-29 LAB — CBC
HCT: 40.3 % (ref 36.0–46.0)
Hemoglobin: 13.4 g/dL (ref 12.0–15.0)
MCH: 31 pg (ref 26.0–34.0)
MCHC: 33.3 g/dL (ref 30.0–36.0)
MCV: 93.3 fL (ref 80.0–100.0)
Platelets: 287 10*3/uL (ref 150–400)
RBC: 4.32 MIL/uL (ref 3.87–5.11)
RDW: 12.9 % (ref 11.5–15.5)
WBC: 8.3 10*3/uL (ref 4.0–10.5)
nRBC: 0 % (ref 0.0–0.2)

## 2022-06-29 SURGERY — OPEN REDUCTION INTERNAL FIXATION (ORIF) DISTAL RADIUS FRACTURE
Anesthesia: Monitor Anesthesia Care | Site: Wrist | Laterality: Right

## 2022-06-29 MED ORDER — ROPIVACAINE HCL 5 MG/ML IJ SOLN
INTRAMUSCULAR | Status: DC | PRN
  Administered 2022-06-29: 25 mL via PERINEURAL

## 2022-06-29 MED ORDER — ORAL CARE MOUTH RINSE: 15.0000 mL | Freq: Once | OROMUCOSAL | Status: AC

## 2022-06-29 MED ORDER — MIDAZOLAM HCL 2 MG/2ML IJ SOLN: 1.0000 mg | Freq: Once | INTRAMUSCULAR | Status: AC

## 2022-06-29 MED ORDER — CLINDAMYCIN PHOSPHATE 900 MG/50ML IV SOLN
900.0000 mg | INTRAVENOUS | Status: AC
  Administered 2022-06-29: 900 mg via INTRAVENOUS
  Filled 2022-06-29: qty 50

## 2022-06-29 MED ORDER — PHENYLEPHRINE 80 MCG/ML (10ML) SYRINGE FOR IV PUSH (FOR BLOOD PRESSURE SUPPORT)
PREFILLED_SYRINGE | INTRAVENOUS | Status: AC
  Filled 2022-06-29: qty 10

## 2022-06-29 MED ORDER — DEXAMETHASONE SODIUM PHOSPHATE 10 MG/ML IJ SOLN
INTRAMUSCULAR | Status: DC | PRN
  Administered 2022-06-29: 5 mg

## 2022-06-29 MED ORDER — PROPOFOL 10 MG/ML IV BOLUS
INTRAVENOUS | Status: AC
  Filled 2022-06-29: qty 20

## 2022-06-29 MED ORDER — ESMOLOL HCL 100 MG/10ML IV SOLN
INTRAVENOUS | Status: AC
  Filled 2022-06-29: qty 10

## 2022-06-29 MED ORDER — FENTANYL CITRATE (PF) 250 MCG/5ML IJ SOLN
INTRAMUSCULAR | Status: AC
  Filled 2022-06-29: qty 5

## 2022-06-29 MED ORDER — CHLORHEXIDINE GLUCONATE 0.12 % MT SOLN
15.0000 mL | Freq: Once | OROMUCOSAL | Status: AC
  Administered 2022-06-29: 15 mL via OROMUCOSAL
  Filled 2022-06-29: qty 15

## 2022-06-29 MED ORDER — ACETAMINOPHEN 500 MG PO TABS
1000.0000 mg | ORAL_TABLET | Freq: Once | ORAL | Status: AC
Start: 2022-06-29 — End: 2022-06-29
  Administered 2022-06-29: 1000 mg via ORAL
  Filled 2022-06-29: qty 2

## 2022-06-29 MED ORDER — FENTANYL CITRATE (PF) 100 MCG/2ML IJ SOLN: 100.0000 ug | Freq: Once | INTRAMUSCULAR | Status: AC

## 2022-06-29 MED ORDER — FENTANYL CITRATE (PF) 100 MCG/2ML IJ SOLN
INTRAMUSCULAR | Status: AC
  Administered 2022-06-29: 100 ug via INTRAVENOUS
  Filled 2022-06-29: qty 2

## 2022-06-29 MED ORDER — MIDAZOLAM HCL 2 MG/2ML IJ SOLN
INTRAMUSCULAR | Status: AC
  Administered 2022-06-29: 1 mg via INTRAVENOUS
  Filled 2022-06-29: qty 2

## 2022-06-29 MED ORDER — 0.9 % SODIUM CHLORIDE (POUR BTL) OPTIME
TOPICAL | Status: DC | PRN
  Administered 2022-06-29: 1000 mL

## 2022-06-29 MED ORDER — PROPOFOL 500 MG/50ML IV EMUL
INTRAVENOUS | Status: DC | PRN
  Administered 2022-06-29: 100 ug/kg/min via INTRAVENOUS

## 2022-06-29 MED ORDER — ONDANSETRON HCL 4 MG PO TABS: 8.0000 mg | ORAL_TABLET | Freq: Three times a day (TID) | ORAL | 1 refills | Status: DC | PRN

## 2022-06-29 MED ORDER — PHENYLEPHRINE 80 MCG/ML (10ML) SYRINGE FOR IV PUSH (FOR BLOOD PRESSURE SUPPORT)
PREFILLED_SYRINGE | INTRAVENOUS | Status: DC | PRN
  Administered 2022-06-29 (×2): 80 ug via INTRAVENOUS

## 2022-06-29 MED ORDER — LACTATED RINGERS IV SOLN: INTRAVENOUS | Status: DC

## 2022-06-29 SURGICAL SUPPLY — 53 items
BAG COUNTER SPONGE SURGICOUNT (BAG) ×2 IMPLANT
BAG SPNG CNTER NS LX DISP (BAG) ×1
BIT DRILL 2.2 SS TIBIAL (BIT) IMPLANT
BNDG CMPR 9X4 STRL LF SNTH (GAUZE/BANDAGES/DRESSINGS) ×1
BNDG ELASTIC 3X5.8 VLCR STR LF (GAUZE/BANDAGES/DRESSINGS) ×6 IMPLANT
BNDG ELASTIC 4X5.8 VLCR STR LF (GAUZE/BANDAGES/DRESSINGS) ×2 IMPLANT
BNDG ESMARK 4X9 LF (GAUZE/BANDAGES/DRESSINGS) ×2 IMPLANT
BNDG GAUZE DERMACEA FLUFF 4 (GAUZE/BANDAGES/DRESSINGS) ×6 IMPLANT
BNDG GZE DERMACEA 4 6PLY (GAUZE/BANDAGES/DRESSINGS) ×1
CORD BIPOLAR FORCEPS 12FT (ELECTRODE) ×2 IMPLANT
COVER SURGICAL LIGHT HANDLE (MISCELLANEOUS) ×2 IMPLANT
CUFF TOURN SGL QUICK 18X4 (TOURNIQUET CUFF) ×2 IMPLANT
DRAPE OEC MINIVIEW 54X84 (DRAPES) IMPLANT
DRSG ADAPTIC 3X8 NADH LF (GAUZE/BANDAGES/DRESSINGS) ×2 IMPLANT
GAUZE SPONGE 4X4 12PLY STRL (GAUZE/BANDAGES/DRESSINGS) ×2 IMPLANT
GAUZE XEROFORM 5X9 LF (GAUZE/BANDAGES/DRESSINGS) ×2 IMPLANT
GLOVE BIOGEL M 8.0 STRL (GLOVE) ×2 IMPLANT
GLOVE SS BIOGEL STRL SZ 8 (GLOVE) ×2 IMPLANT
GOWN STRL REUS W/ TWL LRG LVL3 (GOWN DISPOSABLE) ×6 IMPLANT
GOWN STRL REUS W/ TWL XL LVL3 (GOWN DISPOSABLE) ×6 IMPLANT
GOWN STRL REUS W/TWL LRG LVL3 (GOWN DISPOSABLE) ×1
GOWN STRL REUS W/TWL XL LVL3 (GOWN DISPOSABLE) ×1
KIT BASIN OR (CUSTOM PROCEDURE TRAY) ×2 IMPLANT
KIT TURNOVER KIT B (KITS) ×2 IMPLANT
MANIFOLD NEPTUNE II (INSTRUMENTS) ×2 IMPLANT
NS IRRIG 1000ML POUR BTL (IV SOLUTION) ×2 IMPLANT
PACK ORTHO EXTREMITY (CUSTOM PROCEDURE TRAY) ×2 IMPLANT
PAD ARMBOARD 7.5X6 YLW CONV (MISCELLANEOUS) ×4 IMPLANT
PAD CAST 4YDX4 CTTN HI CHSV (CAST SUPPLIES) ×6 IMPLANT
PADDING CAST COTTON 4X4 STRL (CAST SUPPLIES) ×3
PEG LOCKING SMOOTH 2.2X20 (Screw) IMPLANT
PEG LOCKING SMOOTH 2.2X22 (Screw) IMPLANT
PILLOW ARM CARTER ADULT (MISCELLANEOUS) IMPLANT
PLATE CROSSLOCK NAR MINI RT (Plate) IMPLANT
SCREW LOCK 10X2.7X3 LD THRD (Screw) IMPLANT
SCREW LOCK 12X2.7X 3 LD (Screw) IMPLANT
SCREW LOCK 14X2.7X 3 LD TPR (Screw) IMPLANT
SCREW LOCKING 2.7X10MM (Screw) ×1 IMPLANT
SCREW LOCKING 2.7X12MM (Screw) ×1 IMPLANT
SCREW LOCKING 2.7X13MM (Screw) IMPLANT
SCREW LOCKING 2.7X14 (Screw) ×1 IMPLANT
SCREW LOCKING 2.7X8MM (Screw) IMPLANT
SOL PREP POV-IOD 4OZ 10% (MISCELLANEOUS) ×4 IMPLANT
SPLINT FIBERGLASS 3X35 (CAST SUPPLIES) IMPLANT
SUT MNCRL AB 4-0 PS2 18 (SUTURE) ×2 IMPLANT
SUT PROLENE 3 0 PS 2 (SUTURE) IMPLANT
SUT PROLENE 4 0 PS 2 18 (SUTURE) IMPLANT
SUT VIC AB 3-0 FS2 27 (SUTURE) IMPLANT
TOWEL GREEN STERILE (TOWEL DISPOSABLE) ×2 IMPLANT
TOWEL GREEN STERILE FF (TOWEL DISPOSABLE) ×2 IMPLANT
TUBE CONNECTING 12X1/4 (SUCTIONS) ×2 IMPLANT
UNDERPAD 30X36 HEAVY ABSORB (UNDERPADS AND DIAPERS) ×2 IMPLANT
WATER STERILE IRR 1000ML POUR (IV SOLUTION) ×2 IMPLANT

## 2022-06-29 NOTE — Transfer of Care (Signed)
Immediate Anesthesia Transfer of Care Note  Patient: Jaclyn Alexander  Procedure(s) Performed: OPEN REDUCTION INTERNAL FIXATION (ORIF) DISTAL RADIUS FRACTURE (Right: Wrist) BRACHIORADIALIS TENOTOMY (Right: Wrist)  Patient Location: PACU  Anesthesia Type:MAC  Level of Consciousness: drowsy  Airway & Oxygen Therapy: Patient Spontanous Breathing  Post-op Assessment: Report given to RN and Post -op Vital signs reviewed and stable  Post vital signs: Reviewed and stable  Last Vitals:  Vitals Value Taken Time  BP 133/62 06/29/22 1820  Temp 36.4 C 06/29/22 1820  Pulse 74 06/29/22 1826  Resp 18 06/29/22 1826  SpO2 99 % 06/29/22 1826  Vitals shown include unvalidated device data.  Last Pain:  Vitals:   06/29/22 1820  TempSrc:   PainSc: 0-No pain      Patients Stated Pain Goal: 2 (87/[id number]/43 [id number])  Complications: No notable events documented.

## 2022-06-29 NOTE — Anesthesia Procedure Notes (Signed)
Anesthesia Regional Block: Supraclavicular block   Pre-Anesthetic Checklist: , timeout performed,  Correct Patient, Correct Site, Correct Laterality,  Correct Procedure, Correct Position, site marked,  Risks and benefits discussed,  Surgical consent,  Pre-op evaluation,  At surgeon's request and post-op pain management  Laterality: Right  Prep: chloraprep       Needles:  Injection technique: Single-shot  Needle Type: Echogenic Stimulator Needle     Needle Length: 5cm  Needle Gauge: 22     Additional Needles:   Narrative:  Start time: 06/29/2022 5:01 PM End time: 06/29/2022 5:11 PM Injection made incrementally with aspirations every 5 mL.  Performed by: Personally  Anesthesiologist: Duane Boston, MD  Additional Notes: Functioning IV was confirmed and monitors applied.  A 67m 22ga echogenic arrow stimulator was used. Sterile prep and drape,hand hygiene and sterile gloves were used.Ultrasound guidance: relevant anatomy identified, needle position confirmed, local anesthetic spread visualized around nerve(s)., vascular puncture avoided.  Image printed for medical record.  Negative aspiration and negative test dose prior to incremental administration of local anesthetic. The patient tolerated the procedure well.

## 2022-06-29 NOTE — Op Note (Signed)
Operative note June 29, 2022  Date of operation and dictation June 29, 2022  Roseanne Kaufman MD  Preoperative diagnosis: Distal radius fracture comminuted complex greater than 3 part intra-articular  Postop diagnosis: Same  Procedure: #1 right open reduction internal fixation comminuted complex distal radius fracture with DVR [employer] cross lock plate and screw construct.  This was a greater than 3 part intra-articular fracture.  #2 AP lateral and oblique x-rays performed examined and interpreted by myself #3 brachioradialis tenotomy right wrist  Samyukta Cura MD  Anesthesia: Block with IV sedation  Estimated blood loss minimal  Complications none immediate  Operative indications the patient presents for evaluation and surgical care.  Patient understands risk benefits and desires to proceed.  We have discussed with the patient all issues plans and concerns with this in mind we will proceed accordingly. We are planning surgery for your upper extremity. The risk and benefits of surgery to include risk of bleeding, infection, anesthesia,  damage to normal structures and failure of the surgery to accomplish its intended goals of relieving symptoms and restoring function have been discussed in detail. With this in mind we plan to proceed. I have specifically discussed with the patient the pre-and postoperative regime and the dos and don'ts and risk and benefits in great detail. Risk and benefits of surgery also include risk of dystrophy(CRPS), chronic nerve pain, failure of the healing process to go onto completion and other inherent risks of surgery The relavent the pathophysiology of the disease/injury process, as well as the alternatives for treatment and postoperative course of action has been discussed in great detail with the patient who desires to proceed.  We will do everything in our power to help you (the patient) restore function to the upper extremity. It is a pleasure to see this patient  today.    Operative procedure: Patient was seen by myself and anesthesia.  Appropriate anesthesia was induced and following this the patient was prepped with a Hibiclens pre-scrub followed by 10-minute surgical Betadine scrub and paint.  Once this was completed the extremity was elevated and the tourniquet was insufflated to 250 mmHg.  Timeout was observed preoperative antibiotics were given and the patient then underwent a very careful and cautious approach to the extremity with volar radial incision under 250 mm tourniquet control.  FCR tendon sheath was identified and dissected.  There were no complicating features.  Once this was accomplished I performed a sliding brachioradialis tenotomy.  This lessen deforming forces of the radial styloid.  Once this was completed the carpal canal contents were retracted ulnarly and the FCR was retracted radially.  We took very meticulous care of the radial artery and the carpal canal contents during the approach.  The pronator was accessed incised and lifted off of the fracture.  The fracture was then reassembled with standard orthopedic equipment and a DVR plate and screw construct from [employer] was accomplished in terms of placement and fixation of the fracture. I was quite pleased with the reduction.  On radiograph one could see the dorsal comminution and dorsal be defect. Adequate radial height, volar tilt and radial inclination was restored.  The distal radial ulnar joint, radiocarpal and midcarpal joints all were  stable and satisfactory.  We irrigated copiously and closed the pronator with 3-0 Vicryl followed by closure of the skin edge with Prolene.  Once again, the distal radius underwent open reduction internal fixation without complications.  The distal radial ulnar joint was stable.  The patient had no complications.  All radiographic parameters look quite well following the fixation.  Standard dressing of Adaptic Xeroform 4 x 4's gauze web roll  Kerlix and a volar splint were applied.  The patient understands instructions of elevate move massage fingers notify us any problems occur and follow-up care according to our standard protocol for a narrow DVR plate and screw construct.  He has been a pleasure participate in the patient's care and we look forward to spent in the patient's recovery. Patient and family have my cellular phone should any problems occur.  It was a pleasure to see her today and discussed with patient care plan postop. Roseanne Kaufman MD

## 2022-06-29 NOTE — Discharge Instructions (Signed)
Please elevate move and massage her fingers.  For any problems or emergencies call Dr. Amedeo Plenty at [phone]  The surgery went wonderfully.  We recommend that you to take vitamin C 1000 mg a day to promote healing. We also recommend that if you require  pain medicine that you take a stool softener to prevent constipation as most pain medicines will have constipation side effects. We recommend either Peri-Colace or Senokot and recommend that you also consider adding MiraLAX as well to prevent the constipation affects from pain medicine if you are required to use them. These medicines are over the counter and may be purchased at a local pharmacy. A cup of yogurt and a probiotic can also be helpful during the recovery process as the medicines can disrupt your intestinal environment.Keep bandage clean and dry.  Call for any problems.  No smoking.  Criteria for driving a car: you should be off your pain medicine for 7-8 hours, able to drive one handed(confident), thinking clearly and feeling able in your judgement to drive. Continue elevation as it will decrease swelling.  If instructed by MD move your fingers within the confines of the bandage/splint.  Use ice if instructed by your MD. Call immediately for any sudden loss of feeling in your hand/arm or change in functional abilities of the extremity.

## 2022-06-29 NOTE — H&P (Signed)
Jaclyn Alexander is an 62 y.o. female.   Chief Complaint: Fracture right distal radius and articular greater than 3 part displaced HPI: Patient presents for open reduction internal fixation right distal radius fracture comminuted complex intra-articular.  Will plan for surgical reconstruction.  Patient presents for evaluation and treatment of the of their upper extremity predicament. The patient denies neck, back, chest or  abdominal pain. The patient notes that they have no lower extremity problems. The patients primary complaint is noted. We are planning surgical care pathway for the upper extremity.   Past Medical History:  Diagnosis Date  . Acid reflux   . Allergy    seasonal  . Anemia   . Basal cell carcinoma 06/2011  . HSV-2 infection   . Hypothyroidism   . Melanoma ([place]) 10/2008   basal cell  . Migraine   . Osteoporosis   . Thyroid disease     Past Surgical History:  Procedure Laterality Date  . BASAL CELL CARCINOMA EXCISION    . COLONOSCOPY  2015  . dental implants     x2  . GYNECOLOGIC CRYOSURGERY  1980s  . LAPAROSCOPIC CHOLECYSTECTOMY    . LASIK    . MOHS SURGERY    . TONSILLECTOMY    . UPPER GASTROINTESTINAL ENDOSCOPY  2015    Family History  Problem Relation Age of Onset  . Lung cancer Mother   . Thyroid disease Mother   . Cancer Other        maternal side-lung cancer and melanoma  . Hypertension Paternal Grandfather   . Heart attack Father   . Stroke Other        maternal side  . Bipolar disorder Brother   . Colon cancer Neg Hx   . Esophageal cancer Neg Hx   . Stomach cancer Neg Hx   . Rectal cancer Neg Hx    Social History:  reports that she has never smoked. She has never used smokeless tobacco. She reports current alcohol use. She reports that she does not use drugs.  Allergies:  Allergies  Allergen Reactions  . Penicillins Other (See Comments)    reactive on allergy test  . Prolia [Denosumab] Itching  . Sulfonamide Derivatives Rash     Medications Prior to Admission  Medication Sig Dispense Refill  . acetaminophen (TYLENOL) 650 MG CR tablet Take 650 mg by mouth every 8 (eight) hours as needed for pain.    . Ascorbic Acid (VITAMIN C) 1000 MG tablet Take 1,000 mg by mouth daily.    Marland Kitchen BLACK ELDERBERRY PO Take 2 tablets by mouth in the morning. Gummies    . Cholecalciferol (VITAMIN D3) 50 MCG (2000 UT) TABS Take 2,000 Units by mouth in the morning.    . fexofenadine (ALLEGRA) 180 MG tablet Take 180 mg by mouth at bedtime.    . fluticasone (FLONASE) 50 MCG/ACT nasal spray Place 2 sprays into the nose daily as needed for rhinitis or allergies.    Marland Kitchen ibuprofen (ADVIL) 200 MG tablet Take 400 mg by mouth every 8 (eight) hours as needed (pain.).    Marland Kitchen lansoprazole (PREVACID) 30 MG capsule TAKE 1 CAPSULE DAILY (Patient taking differently: Take 30 mg by mouth daily at 12 noon.) 90 capsule 3  . SYNTHROID 75 MCG tablet Take 75 mcg by mouth daily before breakfast.    . topiramate (TOPAMAX) 50 MG tablet Take 75 mg by mouth at bedtime.    . valACYclovir (VALTREX) 500 MG tablet Take 1 tablet (500 mg total)  by mouth daily. (Patient taking differently: Take 500 mg by mouth daily as needed (outbreaks).) 90 tablet 4  . polyethylene glycol (MIRALAX / GLYCOLAX) 17 g packet Take 17 g by mouth daily as needed (constipation.).      Results for orders placed or performed during the hospital encounter of 06/29/22 (from the past 48 hour(s))  CBC per protocol     Status: None   Collection Time: 06/29/22  3:14 PM  Result Value Ref Range   WBC 8.3 4.0 - 10.5 K/uL   RBC 4.32 3.87 - 5.11 MIL/uL   Hemoglobin 13.4 12.0 - 15.0 g/dL   HCT 40.3 36.0 - 46.0 %   MCV 93.3 80.0 - 100.0 fL   MCH 31.0 26.0 - 34.0 pg   MCHC 33.3 30.0 - 36.0 g/dL   RDW 12.9 11.5 - 15.5 %   Platelets 287 150 - 400 K/uL   nRBC 0.0 0.0 - 0.2 %    Comment: Performed at [facility], [place] [address] [place], [place] [address]   No results found.  Review of Systems   Cardiovascular: Negative.   Gastrointestinal: Negative.   Endocrine: Negative.   Genitourinary: Negative.    Blood pressure (!) 154/83, pulse 73, temperature 97.9 F (36.6 C), temperature source Oral, resp. rate 18, height '5\' 3"'$  (1.6 m), weight 59 kg, last menstrual period 10/20/2010, SpO2 97 %. Physical Exam  , Complex and articular distal radius fracture right upper extremity will plan for open reduction internal fixation.  She understands all risk and benefits and desires to proceed.  The patient is alert and oriented in no acute distress. The patient complains of pain in the affected upper extremity.  The patient is noted to have a normal HEENT exam. Lung fields show equal chest expansion and no shortness of breath. Abdomen exam is nontender without distention. Lower extremity examination does not show any fracture dislocation or blood clot symptoms. Pelvis is stable and the neck and back are stable and nontender.  Assessment/Plan Will plan for open duction internal fixation right distal radius fracture with brachioradialis tenotomy and repair is necessary.  All questions have been addressed.  We are planning surgery for your upper extremity. The risk and benefits of surgery to include risk of bleeding, infection, anesthesia,  damage to normal structures and failure of the surgery to accomplish its intended goals of relieving symptoms and restoring function have been discussed in detail. With this in mind we plan to proceed. I have specifically discussed with the patient the pre-and postoperative regime and the dos and don'ts and risk and benefits in great detail. Risk and benefits of surgery also include risk of dystrophy(CRPS), chronic nerve pain, failure of the healing process to go onto completion and other inherent risks of surgery The relavent the pathophysiology of the disease/injury process, as well as the alternatives for treatment and postoperative course of action has been  discussed in great detail with the patient who desires to proceed.  We will do everything in our power to help you (the patient) restore function to the upper extremity. It is a pleasure to see this patient today.   Willa Frater III, MD 06/29/2022, 4:46 PM

## 2022-06-29 NOTE — Anesthesia Preprocedure Evaluation (Addendum)
Anesthesia Evaluation  Patient identified by MRN, date of birth, ID band Patient awake    Reviewed: Allergy & Precautions, NPO status , Patient's Chart, lab work & pertinent test results  History of Anesthesia Complications Negative for: history of anesthetic complications  Airway Mallampati: II  TM Distance: >3 FB Neck ROM: Full    Dental no notable dental hx. (+) Dental Advisory Given   Pulmonary neg pulmonary ROS   Pulmonary exam normal        Cardiovascular negative cardio ROS Normal cardiovascular exam     Neuro/Psych  Headaches  Anxiety        GI/Hepatic Neg liver ROS, hiatal hernia,GERD  ,,  Endo/Other  Hypothyroidism    Renal/GU negative Renal ROS     Musculoskeletal negative musculoskeletal ROS (+)    Abdominal   Peds  Hematology negative hematology ROS (+)   Anesthesia Other Findings   Reproductive/Obstetrics                             Anesthesia Physical Anesthesia Plan  ASA: 2  Anesthesia Plan: MAC   Post-op Pain Management: Regional block* and Tylenol PO (pre-op)*   Induction: Intravenous  PONV Risk Score and Plan: 2 and Ondansetron, Dexamethasone and Midazolam  Airway Management Planned: Natural Airway  Additional Equipment:   Intra-op Plan:   Post-operative Plan:   Informed Consent: I have reviewed the patients History and Physical, chart, labs and discussed the procedure including the risks, benefits and alternatives for the proposed anesthesia with the patient or authorized representative who has indicated his/her understanding and acceptance.     Dental advisory given  Plan Discussed with: Anesthesiologist, CRNA and Surgeon  Anesthesia Plan Comments:        Anesthesia Quick Evaluation

## 2022-06-30 NOTE — Anesthesia Postprocedure Evaluation (Signed)
Anesthesia Post Note  Patient: Jaclyn Alexander  Procedure(s) Performed: OPEN REDUCTION INTERNAL FIXATION (ORIF) DISTAL RADIUS FRACTURE (Right: Wrist) BRACHIORADIALIS TENOTOMY (Right: Wrist)     Patient location during evaluation: PACU Anesthesia Type: Regional Level of consciousness: awake and alert Pain management: pain level controlled Vital Signs Assessment: post-procedure vital signs reviewed and stable Respiratory status: spontaneous breathing Cardiovascular status: stable Anesthetic complications: no   No notable events documented.  Last Vitals:  Vitals:   06/29/22 1820 06/29/22 1835  BP: 133/62 (!) 152/81  Pulse: 75 68  Resp: 18 16  Temp: 36.4 C 36.4 C  SpO2: 99% 97%    Last Pain:  Vitals:   06/29/22 1820  TempSrc:   PainSc: 0-No pain                 Nolon Nations

## 2022-07-01 ENCOUNTER — Encounter (HOSPITAL_COMMUNITY): Payer: Self-pay | Admitting: Orthopedic Surgery

## 2022-09-30 ENCOUNTER — Other Ambulatory Visit: Payer: Self-pay | Admitting: Obstetrics & Gynecology

## 2022-09-30 DIAGNOSIS — Z1231 Encounter for screening mammogram for malignant neoplasm of breast: Secondary | ICD-10-CM

## 2022-11-08 ENCOUNTER — Ambulatory Visit
Admission: RE | Admit: 2022-11-08 | Discharge: 2022-11-08 | Disposition: A | Discharge status: Home / Self Care | Payer: Commercial Managed Care - PPO | Source: Ambulatory Visit | Attending: Obstetrics & Gynecology | Admitting: Obstetrics & Gynecology

## 2022-11-08 DIAGNOSIS — Z1231 Encounter for screening mammogram for malignant neoplasm of breast: Secondary | ICD-10-CM

## 2023-01-31 ENCOUNTER — Other Ambulatory Visit: Payer: Self-pay | Admitting: Gastroenterology

## 2023-05-23 ENCOUNTER — Ambulatory Visit (HOSPITAL_BASED_OUTPATIENT_CLINIC_OR_DEPARTMENT_OTHER): Payer: Commercial Managed Care - PPO | Admitting: Obstetrics & Gynecology

## 2023-05-23 ENCOUNTER — Encounter (HOSPITAL_BASED_OUTPATIENT_CLINIC_OR_DEPARTMENT_OTHER): Payer: Self-pay | Admitting: Obstetrics & Gynecology

## 2023-05-23 ENCOUNTER — Other Ambulatory Visit (HOSPITAL_COMMUNITY)
Admission: RE | Admit: 2023-05-23 | Discharge: 2023-05-23 | Disposition: A | Discharge status: Home / Self Care | Payer: Commercial Managed Care - PPO | Source: Ambulatory Visit | Attending: Obstetrics & Gynecology | Admitting: Obstetrics & Gynecology

## 2023-05-23 VITALS — BP 140/92 | HR 93 | Ht 62.0 in | Wt 115.4 lb

## 2023-05-23 DIAGNOSIS — B009 Herpesviral infection, unspecified: Secondary | ICD-10-CM | POA: Diagnosis not present

## 2023-05-23 DIAGNOSIS — G43009 Migraine without aura, not intractable, without status migrainosus: Secondary | ICD-10-CM | POA: Diagnosis not present

## 2023-05-23 DIAGNOSIS — M810 Age-related osteoporosis without current pathological fracture: Secondary | ICD-10-CM

## 2023-05-23 DIAGNOSIS — Z124 Encounter for screening for malignant neoplasm of cervix: Secondary | ICD-10-CM | POA: Diagnosis present

## 2023-05-23 DIAGNOSIS — Z01419 Encounter for gynecological examination (general) (routine) without abnormal findings: Secondary | ICD-10-CM

## 2023-05-23 DIAGNOSIS — R030 Elevated blood-pressure reading, without diagnosis of hypertension: Secondary | ICD-10-CM

## 2023-05-23 NOTE — Patient Instructions (Addendum)
Call [phone] to schedule an appointment at [facility] radiology.    Can scheduled after 09/10/2023.

## 2023-05-23 NOTE — Progress Notes (Unsigned)
62 y.o. G1P1 Married White or Caucasian female here for annual exam.  Had a fractured radius 06/27/2022 and it was fractured in 5 placed.  Had surgery 06/29/2022.  She some physical therapy afterwards.  Has known osteoporosis.  Really doesn't want to be on medication.  Will recheck BMD 08/2023 when due.  Order placed.  Husband passed earlier this year.  The year has been hard, of course.  Just got back from the beach.  She was there for Thanksgiving with step son and granddaughter.    Denies vaginal bleeding.    Thinking about retirement for next year.    Does not need valtrex RF at this time.  Took a pseudafed this morning.  Also gets anxious about coming to appointments.    Patient's last menstrual period was 10/20/2010.           Health Maintenance: Pap:  04/10/2020 Negative History of abnormal Pap:  remote hx MMG:  11/08/2022 Negative Colonoscopy:  05/13/2020, follow up 5 years BMD:   09/09/2021 Osteoporisis Screening Labs: does with Dr. Sudie Bailey.  Did blood work earlier this year but has follow up scheduled this week.   reports that she has never smoked. She has never used smokeless tobacco. She reports current alcohol use. She reports that she does not use drugs.  Past Medical History:  Diagnosis Date   Acid reflux    Allergy    seasonal   Anemia    Basal cell carcinoma 06/2011   HSV-2 infection    Hypothyroidism    Melanoma (HCC) 10/2008   basal cell   Migraine    Osteoporosis    Thyroid disease     Past Surgical History:  Procedure Laterality Date   BASAL CELL CARCINOMA EXCISION     COLONOSCOPY  2015   dental implants     x2   FLEXOR TENOTOMY  Right 06/29/2022   Procedure: BRACHIORADIALIS TENOTOMY;  Surgeon: Dominica Severin, MD;  Location: [facility] OR;  Service: Orthopedics;  Laterality: Right;   GYNECOLOGIC CRYOSURGERY  1980s   LAPAROSCOPIC CHOLECYSTECTOMY     LASIK     MOHS SURGERY     OPEN REDUCTION INTERNAL FIXATION (ORIF) DISTAL RADIAL FRACTURE Right 06/29/2022    Procedure: OPEN REDUCTION INTERNAL FIXATION (ORIF) DISTAL RADIUS FRACTURE;  Surgeon: Dominica Severin, MD;  Location: [facility] OR;  Service: Orthopedics;  Laterality: Right;   TONSILLECTOMY     UPPER GASTROINTESTINAL ENDOSCOPY  2015    Current Outpatient Medications  Medication Sig Dispense Refill   acetaminophen (TYLENOL) 650 MG CR tablet Take 650 mg by mouth every 8 (eight) hours as needed for pain.     Ascorbic Acid (VITAMIN C) 1000 MG tablet Take 1,000 mg by mouth daily.     Cholecalciferol (VITAMIN D3) 50 MCG (2000 UT) TABS Take 2,000 Units by mouth in the morning.     fexofenadine (ALLEGRA) 180 MG tablet Take 180 mg by mouth at bedtime.     fluticasone (FLONASE) 50 MCG/ACT nasal spray Place 2 sprays into the nose daily as needed for rhinitis or allergies.     ibuprofen (ADVIL) 200 MG tablet Take 400 mg by mouth every 8 (eight) hours as needed (pain.).     lansoprazole (PREVACID) 30 MG capsule TAKE 1 CAPSULE DAILY (Patient taking differently: Take 30 mg by mouth daily at 12 noon.) 90 capsule 3   nitrofurantoin, macrocrystal-monohydrate, (MACROBID) 100 MG capsule Take 100 mg by mouth 2 (two) times daily.     polyethylene glycol (MIRALAX / GLYCOLAX)  17 g packet Take 17 g by mouth daily as needed (constipation.).     SYNTHROID 75 MCG tablet Take 75 mcg by mouth daily before breakfast.     topiramate (TOPAMAX) 50 MG tablet Take 75 mg by mouth at bedtime.     valACYclovir (VALTREX) 500 MG tablet Take 1 tablet (500 mg total) by mouth daily. (Patient taking differently: Take 500 mg by mouth daily as needed (outbreaks).) 90 tablet 4   BLACK ELDERBERRY PO Take 2 tablets by mouth in the morning. Gummies (Patient not taking: Reported on 05/23/2023)     No current facility-administered medications for this visit.    Family History  Problem Relation Age of Onset   Lung cancer Mother    Thyroid disease Mother    Heart attack Father    Breast cancer Paternal Aunt    Hypertension Paternal Grandfather     Bipolar disorder Brother    Cancer Other        maternal side-lung cancer and melanoma   Stroke Other        maternal side   Colon cancer Neg Hx    Esophageal cancer Neg Hx    Stomach cancer Neg Hx    Rectal cancer Neg Hx     ROS: Constitutional: negative Genitourinary:negative  Exam:   BP (!) 155/103 (BP Location: Right Arm, Patient Position: Sitting, Cuff Size: Normal) Comment: Patient did take some sudafed this morning  Pulse 93   Ht 5\' 2"  (1.575 m)   Wt 115 lb 6.4 oz (52.3 kg)   LMP 10/20/2010   BMI 21.11 kg/m   Height: 5\' 2"  (157.5 cm)  General appearance: alert, cooperative and appears stated age Head: Normocephalic, without obvious abnormality, atraumatic Neck: no adenopathy, supple, symmetrical, trachea midline and thyroid normal to inspection and palpation Lungs: clear to auscultation bilaterally Breasts: normal appearance, no masses or tenderness Heart: regular rate and rhythm Abdomen: soft, non-tender; bowel sounds normal; no masses,  no organomegaly Extremities: extremities normal, atraumatic, no cyanosis or edema Skin: Skin color, texture, turgor normal. No rashes or lesions Lymph nodes: Cervical, supraclavicular, and axillary nodes normal. No abnormal inguinal nodes palpated Neurologic: Grossly normal   Pelvic: External genitalia:  no lesions              Urethra:  normal appearing urethra with no masses, tenderness or lesions              Bartholins and Skenes: normal                 Vagina: normal appearing vagina with normal color and no discharge, no lesions              Cervix: no lesions              Pap taken: Yes.   Bimanual Exam:  Uterus:  normal size, contour, position, consistency, mobility, non-tender              Adnexa: normal adnexa and no mass, fullness, tenderness               Rectovaginal: Confirms               Anus:  normal sphincter tone, no lesions  Chaperone, Ina Homes, CMA, was present for exam.  Assessment/Plan: 1. Well  woman exam with routine gynecological exam - Pap smear obtained today - Mammogram *** - Colonoscopy *** - Bone mineral density *** - lab work done with PCP, Dr. Marland Kitchen - vaccines reviewed/updated  2. HSV-2 infection -does not need RF for valtrex at this time  3. Migraine without aura and without status migrainosus, not intractable  4. Age-related osteoporosis without current pathological fracture - DG BONE DENSITY (DXA); Future  5. Cervical cancer screening - Cytology - PAP( [place])   6. Elevated blood pressure reading without diagnosis of hypertension - pt will get blood pressure cuff and monitor at home.  Has follow up with PCP later this week.

## 2023-05-25 LAB — CYTOLOGY - PAP: Diagnosis: NEGATIVE

## 2023-09-12 ENCOUNTER — Other Ambulatory Visit (HOSPITAL_BASED_OUTPATIENT_CLINIC_OR_DEPARTMENT_OTHER): Payer: Commercial Managed Care - PPO

## 2023-09-22 ENCOUNTER — Other Ambulatory Visit (HOSPITAL_BASED_OUTPATIENT_CLINIC_OR_DEPARTMENT_OTHER)

## 2023-09-23 ENCOUNTER — Ambulatory Visit (HOSPITAL_BASED_OUTPATIENT_CLINIC_OR_DEPARTMENT_OTHER)
Admission: RE | Admit: 2023-09-23 | Discharge: 2023-09-23 | Disposition: A | Discharge status: Home / Self Care | Source: Ambulatory Visit | Attending: Obstetrics & Gynecology | Admitting: Obstetrics & Gynecology

## 2023-09-23 DIAGNOSIS — M810 Age-related osteoporosis without current pathological fracture: Secondary | ICD-10-CM | POA: Diagnosis not present

## 2023-09-24 ENCOUNTER — Other Ambulatory Visit (HOSPITAL_BASED_OUTPATIENT_CLINIC_OR_DEPARTMENT_OTHER): Payer: Self-pay | Admitting: Obstetrics & Gynecology

## 2023-09-27 ENCOUNTER — Other Ambulatory Visit: Payer: Self-pay | Admitting: Obstetrics & Gynecology

## 2023-09-27 DIAGNOSIS — Z1231 Encounter for screening mammogram for malignant neoplasm of breast: Secondary | ICD-10-CM

## 2023-09-30 ENCOUNTER — Other Ambulatory Visit (HOSPITAL_BASED_OUTPATIENT_CLINIC_OR_DEPARTMENT_OTHER): Payer: Self-pay | Admitting: Obstetrics & Gynecology

## 2023-09-30 DIAGNOSIS — M810 Age-related osteoporosis without current pathological fracture: Secondary | ICD-10-CM

## 2023-10-26 ENCOUNTER — Ambulatory Visit: Admitting: Physician Assistant

## 2023-11-02 ENCOUNTER — Ambulatory Visit: Admitting: Physician Assistant

## 2023-11-16 ENCOUNTER — Ambulatory Visit
Admission: RE | Admit: 2023-11-16 | Discharge: 2023-11-16 | Disposition: A | Discharge status: Home / Self Care | Source: Ambulatory Visit | Attending: Obstetrics & Gynecology | Admitting: Obstetrics & Gynecology

## 2023-11-16 DIAGNOSIS — Z1231 Encounter for screening mammogram for malignant neoplasm of breast: Secondary | ICD-10-CM

## 2023-11-30 ENCOUNTER — Ambulatory Visit: Admitting: Physician Assistant

## 2023-12-26 ENCOUNTER — Ambulatory Visit: Admitting: Physician Assistant

## 2024-01-18 ENCOUNTER — Ambulatory Visit: Admitting: Physician Assistant

## 2024-04-23 ENCOUNTER — Encounter: Payer: Self-pay | Admitting: Radiology

## 2024-06-04 ENCOUNTER — Ambulatory Visit (HOSPITAL_BASED_OUTPATIENT_CLINIC_OR_DEPARTMENT_OTHER): Payer: Commercial Managed Care - PPO | Admitting: Obstetrics & Gynecology
